# Patient Record
Sex: Female | Born: 1960 | Race: White | Hispanic: No | Marital: Married | Smoking: Former smoker
Health system: Southern US, Community
[De-identification: ages and names within clinical notes are randomized; demographics above are authoritative.]

## PROBLEM LIST (undated history)

## (undated) DIAGNOSIS — I471 Supraventricular tachycardia, unspecified: Secondary | ICD-10-CM

## (undated) DIAGNOSIS — E7800 Pure hypercholesterolemia, unspecified: Secondary | ICD-10-CM

## (undated) DIAGNOSIS — D649 Anemia, unspecified: Secondary | ICD-10-CM

## (undated) DIAGNOSIS — F17200 Nicotine dependence, unspecified, uncomplicated: Secondary | ICD-10-CM

## (undated) DIAGNOSIS — IMO0001 Reserved for inherently not codable concepts without codable children: Secondary | ICD-10-CM

## (undated) DIAGNOSIS — N63 Unspecified lump in unspecified breast: Secondary | ICD-10-CM

## (undated) DIAGNOSIS — F329 Major depressive disorder, single episode, unspecified: Secondary | ICD-10-CM

## (undated) DIAGNOSIS — L98499 Non-pressure chronic ulcer of skin of other sites with unspecified severity: Secondary | ICD-10-CM

## (undated) DIAGNOSIS — J309 Allergic rhinitis, unspecified: Secondary | ICD-10-CM

## (undated) DIAGNOSIS — E559 Vitamin D deficiency, unspecified: Secondary | ICD-10-CM

## (undated) DIAGNOSIS — F32A Depression, unspecified: Secondary | ICD-10-CM

## (undated) DIAGNOSIS — R51 Headache: Secondary | ICD-10-CM

## (undated) DIAGNOSIS — T7840XA Allergy, unspecified, initial encounter: Secondary | ICD-10-CM

## (undated) DIAGNOSIS — E669 Obesity, unspecified: Secondary | ICD-10-CM

## (undated) DIAGNOSIS — F419 Anxiety disorder, unspecified: Secondary | ICD-10-CM

## (undated) DIAGNOSIS — I10 Essential (primary) hypertension: Secondary | ICD-10-CM

## (undated) DIAGNOSIS — M129 Arthropathy, unspecified: Secondary | ICD-10-CM

## (undated) DIAGNOSIS — N951 Menopausal and female climacteric states: Secondary | ICD-10-CM

## (undated) DIAGNOSIS — L609 Nail disorder, unspecified: Secondary | ICD-10-CM

## (undated) DIAGNOSIS — R011 Cardiac murmur, unspecified: Secondary | ICD-10-CM

## (undated) DIAGNOSIS — E538 Deficiency of other specified B group vitamins: Secondary | ICD-10-CM

## (undated) DIAGNOSIS — M1990 Unspecified osteoarthritis, unspecified site: Secondary | ICD-10-CM

## (undated) HISTORY — DX: Reserved for inherently not codable concepts without codable children: IMO0001

## (undated) HISTORY — DX: Unspecified lump in unspecified breast: N63.0

## (undated) HISTORY — DX: Cardiac murmur, unspecified: R01.1

## (undated) HISTORY — DX: Essential (primary) hypertension: I10

## (undated) HISTORY — PX: OTHER SURGICAL HISTORY: SHX169

## (undated) HISTORY — DX: Vitamin D deficiency, unspecified: E55.9

## (undated) HISTORY — DX: Headache: R51

## (undated) HISTORY — DX: Non-pressure chronic ulcer of skin of other sites with unspecified severity: L98.499

## (undated) HISTORY — DX: Allergic rhinitis, unspecified: J30.9

## (undated) HISTORY — DX: Pure hypercholesterolemia, unspecified: E78.00

## (undated) HISTORY — DX: Nail disorder, unspecified: L60.9

## (undated) HISTORY — DX: Depression, unspecified: F32.A

## (undated) HISTORY — DX: Unspecified osteoarthritis, unspecified site: M19.90

## (undated) HISTORY — DX: Allergy, unspecified, initial encounter: T78.40XA

## (undated) HISTORY — DX: Anemia, unspecified: D64.9

## (undated) HISTORY — DX: Arthropathy, unspecified: M12.9

## (undated) HISTORY — DX: Menopausal and female climacteric states: N95.1

## (undated) HISTORY — DX: Obesity, unspecified: E66.9

## (undated) HISTORY — DX: Nicotine dependence, unspecified, uncomplicated: F17.200

## (undated) HISTORY — DX: Supraventricular tachycardia, unspecified: I47.10

## (undated) HISTORY — DX: Major depressive disorder, single episode, unspecified: F32.9

## (undated) HISTORY — DX: Deficiency of other specified B group vitamins: E53.8

## (undated) HISTORY — DX: Anxiety disorder, unspecified: F41.9

## (undated) HISTORY — DX: Supraventricular tachycardia: I47.1

---

## 1995-05-13 HISTORY — PX: ELECTROPHYSIOLOGY STUDY: SHX5802

## 1999-05-13 HISTORY — PX: EYE SURGERY: SHX253

## 2004-10-01 ENCOUNTER — Ambulatory Visit: Payer: Self-pay | Admitting: Internal Medicine

## 2004-10-04 ENCOUNTER — Ambulatory Visit: Payer: Self-pay | Admitting: Internal Medicine

## 2005-10-03 ENCOUNTER — Ambulatory Visit: Payer: Self-pay | Admitting: Internal Medicine

## 2007-07-02 ENCOUNTER — Ambulatory Visit: Payer: Self-pay | Admitting: Sports Medicine

## 2007-07-02 DIAGNOSIS — M7750 Other enthesopathy of unspecified foot: Secondary | ICD-10-CM | POA: Insufficient documentation

## 2007-07-05 ENCOUNTER — Encounter: Payer: Self-pay | Admitting: Sports Medicine

## 2008-08-16 ENCOUNTER — Ambulatory Visit: Payer: Self-pay | Admitting: Family Medicine

## 2009-01-13 ENCOUNTER — Emergency Department: Payer: Self-pay | Admitting: Unknown Physician Specialty

## 2009-09-12 ENCOUNTER — Ambulatory Visit: Payer: Self-pay | Admitting: Family Medicine

## 2010-09-25 ENCOUNTER — Ambulatory Visit: Payer: Self-pay | Admitting: Family Medicine

## 2011-10-29 ENCOUNTER — Ambulatory Visit: Payer: Self-pay | Admitting: Family Medicine

## 2011-11-04 LAB — HM PAP SMEAR

## 2011-11-06 ENCOUNTER — Ambulatory Visit: Payer: Self-pay | Admitting: Family Medicine

## 2012-03-12 ENCOUNTER — Telehealth: Payer: Self-pay

## 2012-03-12 ENCOUNTER — Other Ambulatory Visit: Payer: Self-pay | Admitting: Family Medicine

## 2012-03-12 NOTE — Telephone Encounter (Signed)
No paper chart °

## 2012-03-12 NOTE — Telephone Encounter (Signed)
Call pt back --- 1.  I am happy to refill Cymbalta 60mg  two tablets daily #180 1 refill. 2.  I usually see her every six months; once for a physical and six months later for depression/anxiety and cholesterol follow-up.  She will need appointment with me in upcoming three months (which will be six months from last visit); please have her schedule appointment.  KMS

## 2012-03-12 NOTE — Telephone Encounter (Signed)
Pt is former pt of Dr. Katrinka Blazing and has not established care here in our office. Pt is trying to get a rx refill on her symbolta and has called her pharmacy they said they cant fill and the Dr. Isidore Moos in [place] has denied filling rx. Please contact pt to advise on further instructions. [facility] [place] on [address] Pt [phone]

## 2012-03-12 NOTE — Telephone Encounter (Signed)
I have spoken to patient. [facility] uses [facility] [place] she takes Cymbalta 60mg  bid. I have advised her we can not fill until she is established, but since the office in [place] has denied this request, I told her I will ask for an exception. Patient states she saw Dr Katrinka Blazing just before she left [place], and is not due for follow up until 1 yr. Please advise, I will call patient.

## 2012-03-13 MED ORDER — DULOXETINE HCL 60 MG PO CPEP: ORAL_CAPSULE | ORAL | Status: DC

## 2012-03-13 NOTE — Telephone Encounter (Signed)
Patient notified and voiced understanding. Cymbalta sent to pharmacy

## 2012-04-30 ENCOUNTER — Ambulatory Visit: Payer: Self-pay | Admitting: Family Medicine

## 2012-04-30 LAB — HM MAMMOGRAPHY

## 2012-05-06 ENCOUNTER — Telehealth: Payer: Self-pay

## 2012-05-06 NOTE — Telephone Encounter (Signed)
LMOM for pt to CB. Give her results of DX Mammogram (R breast) - Normal results, area of density smaller/less conspicuous than on previous mammogram, pt should return to yearly f/up mammograms in June 2014. Copy of results at [facility] pending box.

## 2012-05-12 ENCOUNTER — Encounter: Payer: Self-pay | Admitting: Radiology

## 2012-05-12 DIAGNOSIS — R928 Other abnormal and inconclusive findings on diagnostic imaging of breast: Secondary | ICD-10-CM

## 2012-05-12 NOTE — Telephone Encounter (Signed)
Called patient to advise  °

## 2012-05-24 ENCOUNTER — Ambulatory Visit: Payer: Self-pay | Admitting: Family Medicine

## 2012-05-31 ENCOUNTER — Ambulatory Visit (INDEPENDENT_AMBULATORY_CARE_PROVIDER_SITE_OTHER): Payer: BC Managed Care – PPO | Admitting: Family Medicine

## 2012-05-31 ENCOUNTER — Encounter: Payer: Self-pay | Admitting: Family Medicine

## 2012-05-31 VITALS — BP 152/96 | HR 101 | Temp 98.4°F | Resp 16 | Ht 64.5 in | Wt 196.0 lb

## 2012-05-31 DIAGNOSIS — E7800 Pure hypercholesterolemia, unspecified: Secondary | ICD-10-CM

## 2012-05-31 DIAGNOSIS — F341 Dysthymic disorder: Secondary | ICD-10-CM

## 2012-05-31 DIAGNOSIS — F419 Anxiety disorder, unspecified: Secondary | ICD-10-CM | POA: Insufficient documentation

## 2012-05-31 DIAGNOSIS — R7309 Other abnormal glucose: Secondary | ICD-10-CM

## 2012-05-31 DIAGNOSIS — F32A Depression, unspecified: Secondary | ICD-10-CM | POA: Insufficient documentation

## 2012-05-31 DIAGNOSIS — E559 Vitamin D deficiency, unspecified: Secondary | ICD-10-CM

## 2012-05-31 DIAGNOSIS — IMO0001 Reserved for inherently not codable concepts without codable children: Secondary | ICD-10-CM | POA: Insufficient documentation

## 2012-05-31 DIAGNOSIS — R030 Elevated blood-pressure reading, without diagnosis of hypertension: Secondary | ICD-10-CM

## 2012-05-31 DIAGNOSIS — F329 Major depressive disorder, single episode, unspecified: Secondary | ICD-10-CM

## 2012-05-31 DIAGNOSIS — R928 Other abnormal and inconclusive findings on diagnostic imaging of breast: Secondary | ICD-10-CM

## 2012-05-31 LAB — CBC WITH DIFFERENTIAL/PLATELET
Basophils Absolute: 0 10*3/uL (ref 0.0–0.1)
Basophils Relative: 1 % (ref 0–1)
Eosinophils Absolute: 0.2 10*3/uL (ref 0.0–0.7)
MCH: 28.4 pg (ref 26.0–34.0)
MCHC: 33.9 g/dL (ref 30.0–36.0)
Neutrophils Relative %: 60 % (ref 43–77)
Platelets: 237 10*3/uL (ref 150–400)
RBC: 4.75 MIL/uL (ref 3.87–5.11)
RDW: 13.7 % (ref 11.5–15.5)

## 2012-05-31 LAB — LIPID PANEL
HDL: 66 mg/dL (ref >39)
Total CHOL/HDL Ratio: 2.5 Ratio
VLDL: 15 mg/dL (ref 0–40)

## 2012-05-31 LAB — COMPREHENSIVE METABOLIC PANEL
ALT: 24 U/L (ref 0–35)
AST: 21 U/L (ref 0–37)
BUN: 9 mg/dL (ref 6–23)
Creat: 0.69 mg/dL (ref 0.50–1.10)
Total Bilirubin: 0.5 mg/dL (ref 0.3–1.2)

## 2012-05-31 LAB — HEMOGLOBIN A1C
Hgb A1c MFr Bld: 5.7 % — ABNORMAL HIGH (ref <5.7)
Mean Plasma Glucose: 117 mg/dL — ABNORMAL HIGH (ref <117)

## 2012-05-31 LAB — CK: Total CK: 70 U/L (ref 7–177)

## 2012-05-31 MED ORDER — SIMVASTATIN 20 MG PO TABS: 20.0000 mg | ORAL_TABLET | Freq: Every evening | ORAL | Status: DC

## 2012-05-31 MED ORDER — ALPRAZOLAM 0.5 MG PO TABS: 0.5000 mg | ORAL_TABLET | Freq: Every evening | ORAL | Status: DC | PRN

## 2012-05-31 MED ORDER — DULOXETINE HCL 60 MG PO CPEP: ORAL_CAPSULE | ORAL | Status: DC

## 2012-05-31 NOTE — Assessment & Plan Note (Signed)
Uncontrolled; tolerating 4000 IU daily; repeat labs.

## 2012-05-31 NOTE — Assessment & Plan Note (Signed)
Controlled; obtain labs; refill of medication provided.

## 2012-05-31 NOTE — Assessment & Plan Note (Signed)
Stable; obtain labs; continue dietary modification.

## 2012-05-31 NOTE — Assessment & Plan Note (Signed)
Worsening due to relationship stressors; continue Cymbalta 120mg  daily; rx for Xanax to use PRN and sparingly for anxiety and insomnia.

## 2012-05-31 NOTE — Patient Instructions (Addendum)
1. Pure hypercholesterolemia  CBC with Differential, CK, Lipid panel, simvastatin (ZOCOR) 20 MG tablet  2. Other abnormal glucose  Comprehensive metabolic panel, Hemoglobin A1c  3. Unspecified vitamin D deficiency  Vitamin D 25 hydroxy  4. Anxiety and depression  ALPRAZolam (XANAX) 0.5 MG tablet, DULoxetine (CYMBALTA) 60 MG capsule  5. Blood pressure elevated

## 2012-05-31 NOTE — Assessment & Plan Note (Signed)
Persistent.  Home readings stable at 130/90; continue with weight loss attempts, exercise, low salt dietary intake.  If elevates at home, will warrant trial of different medication.  Pt agreeable.

## 2012-05-31 NOTE — Progress Notes (Signed)
[address]   [place], [place]  [address]   [phone]  Subjective:    Patient ID: Karina Sampson, female    DOB: [birth year], 52 y.o.   MRN: [id number]  HPIThis 52 y.o. female presents to establish care and for seven month follow-up:  1. HTN:  Started on Atenolol at last visit 10/2011; took medication at night; made it foggy; took it for three weeks and symptoms persisted; needed to be more alert.  Has been checking 130/90.  Was checking twice weekly and now checking once weekly.  Pulse runs 80.     2.  Anxiety with depression:  Seeing Hilda Blades in [place], Veterinary surgeon.  Started two weeks ago.  Relationship issues.  History of therapy in past.  Not sleeping; trying Melatonin.  Not interested in other sleep aide.  No caffeine after lunch.  Cymbalta 120mg  daily; mood is better.  No previous Xanax in past.  Denies SI/HI.  +increasing anxiety.  Coping well.  If current counseling does not work, will separate with partner.  Together with partner x 12 years; previously together x 5 years.    3. Hyperlipidemia:  Fasting.  No changes to management made at last visit.  Reports good compliance with medications; good tolerance to medications; good symptom control.  Denies HA, dizziness, focal weakness, paresthesias. Denies CP/palp/SOB/leg swelling.  4.  Glucose Intolerance:  Persistent; trying to make healthier food choices.  Fasting today. Exercising more.  Has gained weight.  Denies polyuria, polydipsia.  5.  Vitamin D deficiency:  Seven month follow-up; vitamin D level low in 10/2011.  Increased to 4000 units daily.  Level low 10/2011.    6.  L breast nodularity: new at CPE 10/2011 on mammogram.  Repeat mammogram 04/2012 with decrease in size; recommended returning to normal screening.   Weaned self off of Prempro after abnormal mammogram.   Tolerating hot flashes.  Sex drive down since weaning.  Off of Prempro for one month.    7. S/p flu vaccine at work.      Review of Systems  Constitutional:  Negative for fever, chills, diaphoresis and fatigue.  Eyes: Negative for photophobia and visual disturbance.  Respiratory: Negative for cough, shortness of breath and wheezing.   Cardiovascular: Negative for chest pain, palpitations and leg swelling.  Neurological: Negative for dizziness, tremors, syncope, facial asymmetry, speech difficulty, weakness, light-headedness, numbness and headaches.  Psychiatric/Behavioral: Positive for sleep disturbance. Negative for suicidal ideas, self-injury and decreased concentration. The patient is nervous/anxious.         Past Medical History  Diagnosis Date  . Allergy   . Depression   . Anxiety   . Hypertension   . Heart murmur     Past Surgical History  Procedure Date  . Eye surgery 2001    LASIK- BOTH  . Electrophysiology study 1997    Prior to Admission medications   Medication Sig Start Date End Date Taking? Authorizing Provider  B Complex Vitamins (VITAMIN B COMPLEX PO) Take by mouth daily.   Yes Historical Provider, MD  Cholecalciferol (VITAMIN D3 PO) Take 4,000 Units by mouth daily.   Yes Historical Provider, MD  DULoxetine (CYMBALTA) 60 MG capsule 2 tabs daily 05/31/12  Yes Ethelda Chick, MD  simvastatin (ZOCOR) 20 MG tablet Take 1 tablet (20 mg total) by mouth every evening. 05/31/12  Yes Ethelda Chick, MD  ALPRAZolam Prudy Feeler) 0.5 MG tablet Take 1 tablet (0.5 mg total) by mouth at bedtime as needed for sleep. 05/31/12  Ethelda Chick, MD    Allergies  Allergen Reactions  . Penicillins Diarrhea and Nausea And Vomiting    History   Social History  . Marital Status: Single    Spouse Name: N/A    Number of Children: N/A  . Years of Education: N/A   Occupational History  . [employer]    Social History Main Topics  . Smoking status: Former Smoker    Types: Cigarettes    Quit date: 05/13/1995  . Smokeless tobacco: Not on file  . Alcohol Use: No  . Drug Use: No  . Sexually Active: Yes    Birth Control/ Protection:  None     Comment: number of sex parnters in the last 12 months 1   Other Topics Concern  . Not on file   Social History Narrative  . No narrative on file    Family History  Problem Relation Age of Onset  . Dementia Mother   . Aortic aneurysm Father   . Parkinson's disease Brother     Objective:   Physical Exam  Nursing note and vitals reviewed. Constitutional: She is oriented to person, place, and time. She appears well-developed and well-nourished. No distress.  HENT:  Head: Normocephalic and atraumatic.  Right Ear: External ear normal.  Left Ear: External ear normal.  Nose: Nose normal.  Mouth/Throat: Oropharynx is clear and moist.  Eyes: Conjunctivae normal and EOM are normal. Pupils are equal, round, and reactive to light.  Neck: Normal range of motion. Neck supple. No JVD present. No thyromegaly present.  Cardiovascular: Normal rate, regular rhythm, normal heart sounds and intact distal pulses.  Exam reveals no gallop and no friction rub.   No murmur heard. Pulmonary/Chest: Effort normal and breath sounds normal. She has no wheezes. She has no rales.  Lymphadenopathy:    She has no cervical adenopathy.  Neurological: She is alert and oriented to person, place, and time. No cranial nerve deficit. She exhibits normal muscle tone. Coordination normal.  Skin: She is not diaphoretic.  Psychiatric: She has a normal mood and affect. Her behavior is normal. Judgment and thought content normal.       Assessment & Plan:   1. Pure hypercholesterolemia  CBC with Differential, CK, Lipid panel, simvastatin (ZOCOR) 20 MG tablet  2. Other abnormal glucose  Comprehensive metabolic panel, Hemoglobin A1c  3. Unspecified vitamin D deficiency  Vitamin D 25 hydroxy  4. Anxiety and depression  ALPRAZolam (XANAX) 0.5 MG tablet, DULoxetine (CYMBALTA) 60 MG capsule  5. Blood pressure elevated

## 2012-05-31 NOTE — Assessment & Plan Note (Signed)
Stable/improved.  Has weaned Prempro.  Return to regular annual screening in 10/2012.

## 2012-06-01 LAB — VITAMIN D 25 HYDROXY (VIT D DEFICIENCY, FRACTURES): Vit D, 25-Hydroxy: 60 ng/mL (ref 30–89)

## 2012-06-26 ENCOUNTER — Other Ambulatory Visit: Payer: Self-pay

## 2012-06-29 ENCOUNTER — Encounter: Payer: Self-pay | Admitting: *Deleted

## 2012-07-19 ENCOUNTER — Encounter: Payer: Self-pay | Admitting: Family Medicine

## 2012-11-30 ENCOUNTER — Encounter: Payer: BC Managed Care – PPO | Admitting: Family Medicine

## 2013-02-28 ENCOUNTER — Ambulatory Visit (INDEPENDENT_AMBULATORY_CARE_PROVIDER_SITE_OTHER): Payer: BC Managed Care – PPO | Admitting: Family Medicine

## 2013-02-28 ENCOUNTER — Encounter: Payer: Self-pay | Admitting: Family Medicine

## 2013-02-28 VITALS — BP 130/86 | HR 97 | Temp 98.5°F | Resp 16 | Ht 64.5 in | Wt 195.6 lb

## 2013-02-28 DIAGNOSIS — F329 Major depressive disorder, single episode, unspecified: Secondary | ICD-10-CM

## 2013-02-28 DIAGNOSIS — E7800 Pure hypercholesterolemia, unspecified: Secondary | ICD-10-CM

## 2013-02-28 DIAGNOSIS — F32A Depression, unspecified: Secondary | ICD-10-CM

## 2013-02-28 DIAGNOSIS — Z0000 Encounter for general adult medical examination without abnormal findings: Secondary | ICD-10-CM

## 2013-02-28 DIAGNOSIS — R194 Change in bowel habit: Secondary | ICD-10-CM

## 2013-02-28 DIAGNOSIS — R7309 Other abnormal glucose: Secondary | ICD-10-CM

## 2013-02-28 DIAGNOSIS — Z01419 Encounter for gynecological examination (general) (routine) without abnormal findings: Secondary | ICD-10-CM

## 2013-02-28 LAB — CBC WITH DIFFERENTIAL/PLATELET
Basophils Absolute: 0 10*3/uL (ref 0.0–0.1)
Eosinophils Relative: 4 % (ref 0–5)
HCT: 38.3 % (ref 36.0–46.0)
Hemoglobin: 12.8 g/dL (ref 12.0–15.0)
Lymphocytes Relative: 30 % (ref 12–46)
Lymphs Abs: 1.9 10*3/uL (ref 0.7–4.0)
MCV: 83.1 fL (ref 78.0–100.0)
Monocytes Absolute: 0.7 10*3/uL (ref 0.1–1.0)
Monocytes Relative: 10 % (ref 3–12)
Neutro Abs: 3.5 10*3/uL (ref 1.7–7.7)
WBC: 6.3 10*3/uL (ref 4.0–10.5)

## 2013-02-28 LAB — COMPREHENSIVE METABOLIC PANEL
Albumin: 4.4 g/dL (ref 3.5–5.2)
Alkaline Phosphatase: 76 U/L (ref 39–117)
CO2: 28 mEq/L (ref 19–32)
Calcium: 9.6 mg/dL (ref 8.4–10.5)
Chloride: 103 mEq/L (ref 96–112)
Glucose, Bld: 102 mg/dL — ABNORMAL HIGH (ref 70–99)
Potassium: 3.9 mEq/L (ref 3.5–5.3)
Sodium: 137 mEq/L (ref 135–145)
Total Protein: 6.8 g/dL (ref 6.0–8.3)

## 2013-02-28 LAB — LIPID PANEL: Total CHOL/HDL Ratio: 2.7 Ratio

## 2013-02-28 LAB — HEMOGLOBIN A1C: Mean Plasma Glucose: 120 mg/dL — ABNORMAL HIGH (ref <117)

## 2013-02-28 LAB — TSH: TSH: 1.794 u[IU]/mL (ref 0.350–4.500)

## 2013-02-28 LAB — VITAMIN B12: Vitamin B-12: 459 pg/mL (ref 211–911)

## 2013-02-28 MED ORDER — DULOXETINE HCL 60 MG PO CPEP: ORAL_CAPSULE | ORAL | Status: DC

## 2013-02-28 MED ORDER — FLUTICASONE PROPIONATE 50 MCG/ACT NA SUSP: 2.0000 | Freq: Every day | NASAL | Status: DC

## 2013-02-28 MED ORDER — SIMVASTATIN 20 MG PO TABS: 20.0000 mg | ORAL_TABLET | Freq: Every evening | ORAL | Status: DC

## 2013-02-28 NOTE — Progress Notes (Signed)
Subjective:    Patient ID: Karina Sampson, female    DOB: 09-05-1960, 52 y.o.   MRN: [id number]  HPI Last CPE-11/04/11 Last PAP-11/04/11 normal. Mammo-04/2012, due now. Colonoscopy- willing to schedule due to change in bowel habits in past nine months; +constipated; +large stools. Tdap-09/05/10 Flu- had at work this fall. Dental- Just had, no issues. Eye- had some floaters over the summer and had those checked. Due for regular exam 1/15.  "I'm OK." Work, home ok.  Separating with partner of 13 years; she is going to move out.  Changes in BM, large for couple of months. No dietary changes. No new meds. Has some constipation. Inconsistent from day to day. Has a few hemorrhoids.   1-hypercholesterolemia- taking simvastatin, fish oil. 2- anxiety- tried 1/2 Xanax, didn't like.   3- ASA- 1 qod 4- Vit D def- takes regularly 5- Depression- still on cymbalta two daily.  Major personal stressors. 6-HTN- had bp taken at [facility], 120s. 7-obesity- stable weight, no regular soda or sweet tea.  ETOH- few drinks on weekends Exercise- playing pickleball  Parents- mother with worsening dementia. Parents in their 17's.  Hurt calf muscle playing pickleball last week. Slowly getting better with ibupofen, acetaminophen, ice.  Hearing unchanged, no ringing in ears, no headaches, no dizziness. No chest pain, no palpitations. Occasional "uneasiness in chest," that goes away. No SOB, forever dry nighttime cough. No indigestion/heartburn. + PND Occasional swelling in ankles at end of day No neck pain, + stiffness. Checking breasts regularly. No nausea, no vomiting, no abd. Pain. No bloating. Night sweats improving, occasional hot flashes. Nocturia 1-2x. Occasional urinary incontinence with laughing with full bladder. Doesn't require pads. Occasional vaginal itching, nothing regularly. Insomnia unchanged. Goes to bed 11-12 pm, gets up around 7. Emotionally, things are changing. Good days and bad days.  Decided 1 month ago to split with long term partner. Her partner hasn't moved out yet.  Review of Systems  Respiratory: Positive for cough.        Past Medical History  Diagnosis Date  . Allergy   . Depression   . Anxiety   . Hypertension   . Heart murmur   . Headache(784.0)   . Breast nodule     Right  . Unspecified disease of nail   . Symptomatic menopausal or female climacteric states   . Other B-complex deficiencies   . Ulcer disease   . Arthropathy, unspecified, site unspecified   . SVT (supraventricular tachycardia)   . Anemia, unspecified   . Pure hypercholesterolemia   . Allergic rhinitis, cause unspecified   . Obesity, unspecified   . Tobacco use disorder   . Unspecified vitamin D deficiency    Past Surgical History  Procedure Laterality Date  . Eye surgery  2001    LASIK- BOTH  . Electrophysiology study  1997  . Wisdom teeth extractions    . Svt surgery      with ablation   Allergies  Allergen Reactions  . Penicillins Diarrhea and Nausea And Vomiting   Current Outpatient Prescriptions on File Prior to Visit  Medication Sig Dispense Refill  . ALPRAZolam (XANAX) 0.5 MG tablet Take 1 tablet (0.5 mg total) by mouth at bedtime as needed for sleep.  30 tablet  1  . aspirin 81 MG tablet Take 81 mg by mouth daily.      . B Complex Vitamins (VITAMIN B COMPLEX PO) Take by mouth daily.      . Cholecalciferol (VITAMIN D3 PO) Take 4,000 Units  by mouth daily.      . Omega-3 Fatty Acids (FISH OIL CONCENTRATE) 1000 MG CAPS Take 1,000 mg by mouth daily.       No current facility-administered medications on file prior to visit.   History   Social History  . Marital Status: Single    Spouse Name: N/A    Number of Children: 0  . Years of Education: postgradua   Occupational History  . [employer]     x58yrs   Social History Main Topics  . Smoking status: Former Smoker -- 1.00 packs/day    Types: Cigarettes    Quit date: 05/13/1995  . Smokeless  tobacco: Not on file     Comment: quit 1997  . Alcohol Use: 1.2 oz/week    2 Cans of beer per week     Comment: occasional once a month  . Drug Use: No  . Sexual Activity: Yes    Birth Control/ Protection: None     Comment: number of sex parnters in the last 12 months 1; same sex partners   Other Topics Concern  . Not on file   Social History Narrative    Marital status: Single Dating same sex partner x 13 years; happy, no abuse.      Children: none      Lives: with partner.      Employment: [employer] x 11 years.      Tobacco: none      Alcohol: 1-2 servings on weekend nights.      Drugs: none      Exercise: playing pickleball.      Sexual activity: sexually active with same sexual partner.   Family History  Problem Relation Age of Onset  . Dementia Mother   . Depression Mother   . Aortic aneurysm Father   . Hyperlipidemia Father   . Cancer Father     skin  . Parkinson's disease Brother   . Bone cancer      Objective:   Physical Exam  Nursing note and vitals reviewed. Constitutional: She is oriented to person, place, and time. She appears well-developed and well-nourished. No distress.  HENT:  Head: Normocephalic and atraumatic.  Right Ear: Tympanic membrane, external ear and ear canal normal.  Left Ear: Tympanic membrane, external ear and ear canal normal.  Nose: Nose normal.  Mouth/Throat: Oropharynx is clear and moist.  Eyes: Conjunctivae and EOM are normal. Pupils are equal, round, and reactive to light.  Neck: Normal range of motion. Neck supple. No thyromegaly present.  Cardiovascular: Normal rate, regular rhythm, normal heart sounds and intact distal pulses.   No murmur heard. Pulmonary/Chest: Effort normal and breath sounds normal.  Abdominal: Soft. Bowel sounds are normal. She exhibits no distension and no mass. There is no tenderness. There is no rebound and no guarding.  Genitourinary: Rectum normal, vagina normal and uterus normal. No  breast swelling, tenderness, discharge or bleeding. There is no rash, tenderness or lesion on the right labia. There is no rash, tenderness or lesion on the left labia. Cervix exhibits no motion tenderness, no discharge and no friability. Right adnexum displays no mass, no tenderness and no fullness. Left adnexum displays no mass, no tenderness and no fullness.  Musculoskeletal: Normal range of motion. She exhibits no edema and no tenderness.  Lymphadenopathy:    She has no cervical adenopathy.  Neurological: She is alert and oriented to person, place, and time. She has normal reflexes.  Skin: Skin is  warm and dry. She is not diaphoretic.  Psychiatric: She has a normal mood and affect. Her behavior is normal. Judgment and thought content normal.      Assessment & Plan:  Routine general medical examination at a health care facility - Plan: CBC with Differential, CK, Comprehensive metabolic panel, Hemoglobin A1c, Lipid panel, TSH, Vitamin B12, Vit D  25 hydroxy (rtn osteoporosis monitoring), Iron, IFOBT POC (occult bld, rslt in office), Ambulatory referral to Gastroenterology, CANCELED: EKG 12-Lead, CANCELED: POCT urinalysis dipstick  Routine gynecological examination - Plan: MM Digital Screening  Anxiety and depression - Plan: DULoxetine (CYMBALTA) 60 MG capsule  Other abnormal glucose  Pure hypercholesterolemia - Plan: simvastatin (ZOCOR) 20 MG tablet   1.  CPE: anticipatory guidance --- exercise and weight loss.  Pap smear UTD in 2013.  Refer for mammogram.  Refer for colonoscopy.  Immunizations UTD. Obtain labs.   2.  Gynecological exam: completed; Pap smear UTD in 2013; refer for mammogram. Menopause; no HRT.  Minimal hot flashes at this time. 3.  Hypercholesterolemia: controlled; refill provided; follow-up six months. 4.  Anxiety and depression: stable despite recent break up with partner of 12 years; continue current dose of Cymbalta; minimal improvement with Xanax PRN.   5.  Glucose  intolerance: stable; dietary modification, weight loss, exercise.  Obtain labs. 6. Change in bowel habits:  New.  Large stools and constipation.  Refer for colonoscopy.  Meds ordered this encounter  Medications  . DULoxetine (CYMBALTA) 60 MG capsule    Sig: 2 tabs daily    Dispense:  180 capsule    Refill:  1  . simvastatin (ZOCOR) 20 MG tablet    Sig: Take 1 tablet (20 mg total) by mouth every evening.    Dispense:  90 tablet    Refill:  3   Nilda Simmer, M.D.  [facility] [address] [place], [place]  [address] [phone] phone [phone] fax

## 2013-03-01 LAB — VITAMIN D 25 HYDROXY (VIT D DEFICIENCY, FRACTURES): Vit D, 25-Hydroxy: 63 ng/mL (ref 30–89)

## 2013-03-04 ENCOUNTER — Encounter: Payer: Self-pay | Admitting: Family Medicine

## 2013-03-14 ENCOUNTER — Encounter: Payer: Self-pay | Admitting: Family Medicine

## 2013-03-17 ENCOUNTER — Other Ambulatory Visit: Payer: Self-pay

## 2013-04-14 ENCOUNTER — Ambulatory Visit: Payer: Self-pay | Admitting: Family Medicine

## 2013-04-21 ENCOUNTER — Ambulatory Visit: Payer: Self-pay | Admitting: Gastroenterology

## 2013-05-25 ENCOUNTER — Encounter: Payer: Self-pay | Admitting: Family Medicine

## 2013-08-29 ENCOUNTER — Ambulatory Visit: Payer: BC Managed Care – PPO | Admitting: Family Medicine

## 2013-09-07 ENCOUNTER — Other Ambulatory Visit: Payer: Self-pay | Admitting: Family Medicine

## 2013-09-19 ENCOUNTER — Ambulatory Visit (INDEPENDENT_AMBULATORY_CARE_PROVIDER_SITE_OTHER): Payer: BC Managed Care – PPO | Admitting: Family Medicine

## 2013-09-19 ENCOUNTER — Encounter: Payer: Self-pay | Admitting: Family Medicine

## 2013-09-19 VITALS — BP 130/90 | HR 86 | Temp 98.1°F | Resp 16 | Ht 64.5 in | Wt 199.6 lb

## 2013-09-19 DIAGNOSIS — R7309 Other abnormal glucose: Secondary | ICD-10-CM

## 2013-09-19 DIAGNOSIS — L259 Unspecified contact dermatitis, unspecified cause: Secondary | ICD-10-CM

## 2013-09-19 DIAGNOSIS — M25559 Pain in unspecified hip: Secondary | ICD-10-CM

## 2013-09-19 DIAGNOSIS — M25552 Pain in left hip: Secondary | ICD-10-CM

## 2013-09-19 DIAGNOSIS — G8929 Other chronic pain: Secondary | ICD-10-CM

## 2013-09-19 DIAGNOSIS — R5383 Other fatigue: Secondary | ICD-10-CM

## 2013-09-19 DIAGNOSIS — R5381 Other malaise: Secondary | ICD-10-CM

## 2013-09-19 DIAGNOSIS — J3089 Other allergic rhinitis: Secondary | ICD-10-CM

## 2013-09-19 DIAGNOSIS — M25561 Pain in right knee: Secondary | ICD-10-CM

## 2013-09-19 DIAGNOSIS — F419 Anxiety disorder, unspecified: Secondary | ICD-10-CM

## 2013-09-19 DIAGNOSIS — G4700 Insomnia, unspecified: Secondary | ICD-10-CM

## 2013-09-19 DIAGNOSIS — E7800 Pure hypercholesterolemia, unspecified: Secondary | ICD-10-CM

## 2013-09-19 DIAGNOSIS — M25551 Pain in right hip: Secondary | ICD-10-CM

## 2013-09-19 DIAGNOSIS — F329 Major depressive disorder, single episode, unspecified: Secondary | ICD-10-CM

## 2013-09-19 DIAGNOSIS — M25562 Pain in left knee: Secondary | ICD-10-CM

## 2013-09-19 DIAGNOSIS — F32A Depression, unspecified: Secondary | ICD-10-CM

## 2013-09-19 LAB — CBC WITH DIFFERENTIAL/PLATELET
BASOS PCT: 1 % (ref 0–1)
Basophils Absolute: 0.1 10*3/uL (ref 0.0–0.1)
EOS ABS: 0.1 10*3/uL (ref 0.0–0.7)
Eosinophils Relative: 2 % (ref 0–5)
HEMATOCRIT: 38.4 % (ref 36.0–46.0)
Hemoglobin: 12.7 g/dL (ref 12.0–15.0)
Lymphocytes Relative: 33 % (ref 12–46)
Lymphs Abs: 2.1 10*3/uL (ref 0.7–4.0)
MCH: 27.6 pg (ref 26.0–34.0)
MCHC: 33.1 g/dL (ref 30.0–36.0)
MCV: 83.5 fL (ref 78.0–100.0)
MONO ABS: 0.8 10*3/uL (ref 0.1–1.0)
MONOS PCT: 12 % (ref 3–12)
NEUTROS ABS: 3.4 10*3/uL (ref 1.7–7.7)
Neutrophils Relative %: 52 % (ref 43–77)
Platelets: 262 10*3/uL (ref 150–400)
RBC: 4.6 MIL/uL (ref 3.87–5.11)
RDW: 13.9 % (ref 11.5–15.5)
WBC: 6.5 10*3/uL (ref 4.0–10.5)

## 2013-09-19 LAB — LIPID PANEL
Cholesterol: 185 mg/dL (ref 0–200)
HDL: 67 mg/dL (ref >39)
LDL Cholesterol: 97 mg/dL (ref 0–99)
Total CHOL/HDL Ratio: 2.8 Ratio
Triglycerides: 106 mg/dL (ref <150)
VLDL: 21 mg/dL (ref 0–40)

## 2013-09-19 LAB — COMPLETE METABOLIC PANEL WITH GFR
ALT: 18 U/L (ref 0–35)
AST: 19 U/L (ref 0–37)
Albumin: 4.5 g/dL (ref 3.5–5.2)
Alkaline Phosphatase: 91 U/L (ref 39–117)
BILIRUBIN TOTAL: 0.4 mg/dL (ref 0.2–1.2)
BUN: 9 mg/dL (ref 6–23)
CO2: 27 meq/L (ref 19–32)
CREATININE: 0.66 mg/dL (ref 0.50–1.10)
Calcium: 9.8 mg/dL (ref 8.4–10.5)
Chloride: 100 mEq/L (ref 96–112)
GFR, Est Non African American: >89 mL/min
Glucose, Bld: 90 mg/dL (ref 70–99)
Potassium: 4.1 mEq/L (ref 3.5–5.3)
Sodium: 137 mEq/L (ref 135–145)
Total Protein: 7.1 g/dL (ref 6.0–8.3)

## 2013-09-19 LAB — HEMOGLOBIN A1C
Hgb A1c MFr Bld: 5.7 % — ABNORMAL HIGH (ref <5.7)
MEAN PLASMA GLUCOSE: 117 mg/dL — AB (ref <117)

## 2013-09-19 MED ORDER — FLUTICASONE PROPIONATE 50 MCG/ACT NA SUSP: 2.0000 | Freq: Every day | NASAL | Status: DC

## 2013-09-19 MED ORDER — DULOXETINE HCL 60 MG PO CPEP: 120.0000 mg | ORAL_CAPSULE | Freq: Every day | ORAL | Status: DC

## 2013-09-19 MED ORDER — TRIAMCINOLONE ACETONIDE 0.1 % EX OINT: 1.0000 "application " | TOPICAL_OINTMENT | Freq: Two times a day (BID) | CUTANEOUS | Status: DC

## 2013-09-19 NOTE — Progress Notes (Signed)
Subjective:    Patient ID: Karina Sampson, female    DOB: 1960-08-09, 53 y.o.   MRN: [id number]  09/19/2013  Follow-up; Medication Refill; and abnormal glucose   HPI This 53 y.o. female presents for evaluation six month follow-up:  1.  Malaise and arthralgias:  Onset five months; neck pain.  Joint pain, tired, exhaustion.  After up for a while, feels better.  Feet always hurt; ankles swell and hurt.  Hands hurt; popping.  Shoulders hurt.  Hip hurting.  Playing pickle ball.  Sleeping plenty.  Trying to get more exercise.  No autoimmune processes in the family.  Taking Ibuprofen at night.  Pain keeps up at night.  Bought new mattress; nice pillow.  Snores. Worked a lot of hours around Christmas.  Naps on weekends.  Goes back to bed on weekends.  Last six months.    2. Colon cancer screening:  S/p colonoscopy; normal; repeat in 10 years.    3.  Headaches: much improved.  After a few days, no headaches.    4.  Allergic Rhinitis:  Flonase daily with improvement in headaches.    5.  Depression with anxiety: stable; no recent issues.  Patient reports good compliance with medication, good tolerance to medication, and good symptom control.    6.  Glucose Intolerance: no weight loss; working on exercise; watching diet some.   7.  Hypercholesterolemia:  No changes to management made at last visit.  Patient reports good compliance with medication, good tolerance to medication, and good symptom control.     Review of Systems  Constitutional: Negative for fever, chills, diaphoresis and fatigue.  Eyes: Negative for visual disturbance.  Respiratory: Negative for cough and shortness of breath.   Cardiovascular: Negative for chest pain, palpitations and leg swelling.  Gastrointestinal: Negative for nausea, vomiting, abdominal pain, diarrhea and constipation.  Endocrine: Negative for cold intolerance, heat intolerance, polydipsia, polyphagia and polyuria.  Musculoskeletal: Positive for arthralgias.    Neurological: Negative for dizziness, tremors, seizures, syncope, facial asymmetry, speech difficulty, weakness, light-headedness, numbness and headaches.  Psychiatric/Behavioral: Positive for sleep disturbance and dysphoric mood. Negative for suicidal ideas and self-injury. The patient is nervous/anxious.     Past Medical History  Diagnosis Date  . Allergy   . Depression   . Anxiety   . Hypertension   . Heart murmur   . Headache(784.0)   . Breast nodule     Right  . Unspecified disease of nail   . Symptomatic menopausal or female climacteric states   . Other B-complex deficiencies   . Ulcer disease   . Arthropathy, unspecified, site unspecified   . SVT (supraventricular tachycardia)   . Anemia, unspecified   . Pure hypercholesterolemia   . Allergic rhinitis, cause unspecified   . Obesity, unspecified   . Tobacco use disorder   . Unspecified vitamin D deficiency    Allergies  Allergen Reactions  . Penicillins Diarrhea and Nausea And Vomiting   Current Outpatient Prescriptions  Medication Sig Dispense Refill  . ALPRAZolam (XANAX) 0.5 MG tablet Take 1 tablet (0.5 mg total) by mouth at bedtime as needed for sleep. 30 tablet 1  . aspirin 81 MG tablet Take 81 mg by mouth daily.    . B Complex Vitamins (VITAMIN B COMPLEX PO) Take by mouth daily.    . Cholecalciferol (VITAMIN D3 PO) Take 4,000 Units by mouth daily.    . DULoxetine (CYMBALTA) 60 MG capsule Take 2 capsules (120 mg total) by mouth daily. 180 capsule  1  . fluticasone (FLONASE) 50 MCG/ACT nasal spray Place 2 sprays into both nostrils daily. 16 g 11  . Omega-3 Fatty Acids (FISH OIL CONCENTRATE) 1000 MG CAPS Take 1,000 mg by mouth daily.    . simvastatin (ZOCOR) 20 MG tablet Take 1 tablet (20 mg total) by mouth every evening. 90 tablet 3  . triamcinolone ointment (KENALOG) 0.1 % Apply 1 application topically 2 (two) times daily. 30 g 0   No current facility-administered medications for this visit.       Objective:     BP 130/90 mmHg  Pulse 86  Temp(Src) 98.1 F (36.7 C) (Oral)  Resp 16  Ht 5' 4.5" (1.638 m)  Wt 199 lb 9.6 oz (90.538 kg)  BMI 33.74 kg/m2  SpO2 98% Physical Exam  Constitutional: She is oriented to person, place, and time. She appears well-developed and well-nourished. No distress.  HENT:  Head: Normocephalic and atraumatic.  Right Ear: External ear normal.  Left Ear: External ear normal.  Nose: Nose normal.  Mouth/Throat: Oropharynx is clear and moist.  Eyes: Conjunctivae and EOM are normal. Pupils are equal, round, and reactive to light.  Neck: Normal range of motion. Neck supple. Carotid bruit is not present. No thyromegaly present.  Cardiovascular: Normal rate, regular rhythm, normal heart sounds and intact distal pulses.  Exam reveals no gallop and no friction rub.   No murmur heard. Pulmonary/Chest: Effort normal and breath sounds normal. She has no wheezes. She has no rales.  Abdominal: Soft. Bowel sounds are normal. She exhibits no distension and no mass. There is no tenderness. There is no rebound and no guarding.  Lymphadenopathy:    She has no cervical adenopathy.  Neurological: She is alert and oriented to person, place, and time. No cranial nerve deficit.  Skin: Skin is warm and dry. Rash noted. She is not diaphoretic. No erythema. No pallor.  Psychiatric: She has a normal mood and affect. Her behavior is normal.   Results for orders placed or performed in visit on 09/19/13  CBC with Differential  Result Value Ref Range   WBC 6.5 4.0 - 10.5 K/uL   RBC 4.60 3.87 - 5.11 MIL/uL   Hemoglobin 12.7 12.0 - 15.0 g/dL   HCT 38.4 36.0 - 46.0 %   MCV 83.5 78.0 - 100.0 fL   MCH 27.6 26.0 - 34.0 pg   MCHC 33.1 30.0 - 36.0 g/dL   RDW 13.9 11.5 - 15.5 %   Platelets 262 150 - 400 K/uL   Neutrophils Relative % 52 43 - 77 %   Neutro Abs 3.4 1.7 - 7.7 K/uL   Lymphocytes Relative 33 12 - 46 %   Lymphs Abs 2.1 0.7 - 4.0 K/uL   Monocytes Relative 12 3 - 12 %   Monocytes  Absolute 0.8 0.1 - 1.0 K/uL   Eosinophils Relative 2 0 - 5 %   Eosinophils Absolute 0.1 0.0 - 0.7 K/uL   Basophils Relative 1 0 - 1 %   Basophils Absolute 0.1 0.0 - 0.1 K/uL   Smear Review Criteria for review not met   COMPLETE METABOLIC PANEL WITH GFR  Result Value Ref Range   Sodium 137 135 - 145 mEq/L   Potassium 4.1 3.5 - 5.3 mEq/L   Chloride 100 96 - 112 mEq/L   CO2 27 19 - 32 mEq/L   Glucose, Bld 90 70 - 99 mg/dL   BUN 9 6 - 23 mg/dL   Creat 0.66 0.50 - 1.10 mg/dL  Total Bilirubin 0.4 0.2 - 1.2 mg/dL   Alkaline Phosphatase 91 39 - 117 U/L   AST 19 0 - 37 U/L   ALT 18 0 - 35 U/L   Total Protein 7.1 6.0 - 8.3 g/dL   Albumin 4.5 3.5 - 5.2 g/dL   Calcium 9.8 8.4 - 10.5 mg/dL   GFR, Est African American >89 mL/min   GFR, Est Non African American >89 mL/min  Hemoglobin A1c  Result Value Ref Range   Hgb A1c MFr Bld 5.7 (H) <5.7 %   Mean Plasma Glucose 117 (H) <117 mg/dL  Lipid panel  Result Value Ref Range   Cholesterol 185 0 - 200 mg/dL   Triglycerides 106 <150 mg/dL   HDL 67 >39 mg/dL   Total CHOL/HDL Ratio 2.8 Ratio   VLDL 21 0 - 40 mg/dL   LDL Cholesterol 97 0 - 99 mg/dL       Assessment & Plan:  Pure hypercholesterolemia - Plan: Lipid panel  Other abnormal glucose - Plan: CBC with Differential, COMPLETE METABOLIC PANEL WITH GFR, Hemoglobin A1c  Chronic arthralgias of knees and hips - Plan: Ambulatory referral to Rheumatology  Other malaise and fatigue  Contact dermatitis  Anxiety and depression  Insomnia  Other allergic rhinitis   1. Hypercholesterolemia: controlled; obtain labs; continue current medications. 2.  Glucose Intolerance: stable/improved; recommend weight loss, exercise, low-carb and low-sugar food intake. 3.  Arthralgias: New onset in past five months; refer to rheumatology to rule out autoimmune process. 4.  Malaise and fatigue:  New.  Associated with arthralgias which cause insomnia.   Refer to rheumatology. If persists, refer for sleep  study. 5.  Contact dermatitis: New. Rx for Triamcinolone ointment provided. 6. Anxiety and depression:  Stable; refill of Cymbalta provided. 7. Allergic Rhinitis: stable; refill of Flonase provided.   Meds ordered this encounter  Medications  . triamcinolone ointment (KENALOG) 0.1 %    Sig: Apply 1 application topically 2 (two) times daily.    Dispense:  30 g    Refill:  0  . DULoxetine (CYMBALTA) 60 MG capsule    Sig: Take 2 capsules (120 mg total) by mouth daily.    Dispense:  180 capsule    Refill:  1  . fluticasone (FLONASE) 50 MCG/ACT nasal spray    Sig: Place 2 sprays into both nostrils daily.    Dispense:  16 g    Refill:  11    Return in about 6 months (around 03/22/2014) for complete physical examiniation.    Reginia Forts, M.D.  [facility] [address] [place], [place]  [address] [phone] phone [phone] fax

## 2013-09-19 NOTE — Patient Instructions (Signed)
1.  HOLD SIMVASTATIN FOR THE NEXT 2-3 MONTHS.   2.  IF YOU HAVE NOT RECEIVED NOTIFICATION REGARDING YOUR APPOINTMENT WITH RHEUMATOLOGY, CALL OFFICE.

## 2013-09-22 ENCOUNTER — Encounter: Payer: Self-pay | Admitting: Family Medicine

## 2013-10-06 ENCOUNTER — Other Ambulatory Visit: Payer: Self-pay | Admitting: Family Medicine

## 2013-10-13 ENCOUNTER — Other Ambulatory Visit: Payer: Self-pay | Admitting: Family Medicine

## 2014-03-22 ENCOUNTER — Ambulatory Visit (INDEPENDENT_AMBULATORY_CARE_PROVIDER_SITE_OTHER): Payer: BC Managed Care – PPO | Admitting: Family Medicine

## 2014-03-22 ENCOUNTER — Encounter: Payer: Self-pay | Admitting: Family Medicine

## 2014-03-22 VITALS — BP 133/72 | HR 93 | Temp 98.8°F | Resp 16 | Ht 64.75 in | Wt 186.6 lb

## 2014-03-22 DIAGNOSIS — E785 Hyperlipidemia, unspecified: Secondary | ICD-10-CM

## 2014-03-22 DIAGNOSIS — E669 Obesity, unspecified: Secondary | ICD-10-CM

## 2014-03-22 DIAGNOSIS — M159 Polyosteoarthritis, unspecified: Secondary | ICD-10-CM

## 2014-03-22 DIAGNOSIS — M150 Primary generalized (osteo)arthritis: Secondary | ICD-10-CM

## 2014-03-22 DIAGNOSIS — I10 Essential (primary) hypertension: Secondary | ICD-10-CM

## 2014-03-22 DIAGNOSIS — Z01419 Encounter for gynecological examination (general) (routine) without abnormal findings: Secondary | ICD-10-CM

## 2014-03-22 DIAGNOSIS — E66811 Obesity, class 1: Secondary | ICD-10-CM

## 2014-03-22 DIAGNOSIS — Z0000 Encounter for general adult medical examination without abnormal findings: Secondary | ICD-10-CM

## 2014-03-22 DIAGNOSIS — J301 Allergic rhinitis due to pollen: Secondary | ICD-10-CM

## 2014-03-22 DIAGNOSIS — F32A Depression, unspecified: Secondary | ICD-10-CM

## 2014-03-22 DIAGNOSIS — R7302 Impaired glucose tolerance (oral): Secondary | ICD-10-CM

## 2014-03-22 DIAGNOSIS — F329 Major depressive disorder, single episode, unspecified: Secondary | ICD-10-CM

## 2014-03-22 DIAGNOSIS — F418 Other specified anxiety disorders: Secondary | ICD-10-CM

## 2014-03-22 DIAGNOSIS — F419 Anxiety disorder, unspecified: Secondary | ICD-10-CM

## 2014-03-22 DIAGNOSIS — F5231 Female orgasmic disorder: Secondary | ICD-10-CM

## 2014-03-22 LAB — POCT URINALYSIS DIPSTICK
Bilirubin, UA: NEGATIVE
Glucose, UA: NEGATIVE
KETONES UA: NEGATIVE
Leukocytes, UA: NEGATIVE
Nitrite, UA: NEGATIVE
Protein, UA: NEGATIVE
Spec Grav, UA: <=1.005
Urobilinogen, UA: 0.2
pH, UA: 7

## 2014-03-22 NOTE — Patient Instructions (Signed)

## 2014-03-22 NOTE — Progress Notes (Signed)
Subjective:    Patient ID: Karina Sampson, female    DOB: 10-22-1960, 53 y.o.   MRN: [id number]  03/22/2014  Annual Exam   HPI This 53 y.o. female presents for Complete Physical Exam.  Last physical: 02/28/2013 Pap smear:  11/04/2011 Mammogram: 04/14/2013 Colonoscopy:  04/21/2013.  Repeat in ten years. Bone density:  never TDAP: 09/05/2010 Pneumovax:  never Zostavax: never Influenza: receives at work.  01/24/2014. Eye exam:  2015.  No g/c.  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 Dental exam:  Every six months.   Arthralgias/myalgias:  S/p rheumatology evaluation by Dr. Judith Blonder.  Xrays negative for erosive disease.  Labs negative for autoimmune process.  Rx for Celebrex provided; recommend follow-up in 4-6 months.  Elpidio Anis, PA-C also evaluated patient.   No morning pain now with Celebrex.  No side effects to medication.  Due for renal panel.  Feeling much better on Celebrex. Has not been taking statin since last visit.  Hypercholesterolemia:  Stopped statin at last visit;  Not sure if helped stopping statin.    Inability to reach orgasm: concerned that medication may be interfering with patient to reach orgasm.  In a new relationship; very excited about relationship; not able to reach orgasm.      Review of Systems  Constitutional: Negative for fever, chills, diaphoresis, activity change, appetite change, fatigue and unexpected weight change.  HENT: Negative for congestion, dental problem, drooling, ear discharge, ear pain, facial swelling, hearing loss, mouth sores, nosebleeds, postnasal drip, rhinorrhea, sinus pressure, sneezing, sore throat, tinnitus, trouble swallowing and voice change.   Eyes: Negative for photophobia, pain, discharge, redness, itching and visual disturbance.  Respiratory: Negative for apnea, cough, choking, chest tightness, shortness of breath, wheezing and stridor.   Cardiovascular: Negative for chest pain, palpitations and leg swelling.    Gastrointestinal: Negative for nausea, vomiting, abdominal pain, diarrhea, constipation, blood in stool, abdominal distention, anal bleeding and rectal pain.  Endocrine: Negative for cold intolerance, heat intolerance, polydipsia, polyphagia and polyuria.  Genitourinary: Negative for dysuria, urgency, frequency, hematuria, flank pain, decreased urine volume, vaginal bleeding, vaginal discharge, enuresis, difficulty urinating, genital sores, vaginal pain, menstrual problem, pelvic pain and dyspareunia.  Musculoskeletal: Positive for arthralgias. Negative for myalgias, back pain, joint swelling, gait problem, neck pain and neck stiffness.  Skin: Negative for color change, pallor, rash and wound.  Allergic/Immunologic: Negative for environmental allergies, food allergies and immunocompromised state.  Neurological: Negative for dizziness, tremors, seizures, syncope, facial asymmetry, speech difficulty, weakness, light-headedness, numbness and headaches.  Hematological: Negative for adenopathy. Does not bruise/bleed easily.  Psychiatric/Behavioral: Negative for suicidal ideas, hallucinations, behavioral problems, confusion, sleep disturbance, self-injury, dysphoric mood, decreased concentration and agitation. The patient is not nervous/anxious and is not hyperactive.     Past Medical History  Diagnosis Date  . Allergy   . Depression   . Anxiety   . Hypertension   . Heart murmur   . Headache(784.0)   . Breast nodule     Right  . Unspecified disease of nail   . Symptomatic menopausal or female climacteric states   . Other B-complex deficiencies   . Ulcer disease   . Arthropathy, unspecified, site unspecified   . SVT (supraventricular tachycardia)   . Anemia, unspecified   . Pure hypercholesterolemia   . Allergic rhinitis, cause unspecified   . Obesity, unspecified   . Tobacco use disorder   . Unspecified vitamin D deficiency    Past Surgical History  Procedure Laterality Date  . Eye  surgery  2001  LASIK- BOTH  . Electrophysiology study  1997  . Wisdom teeth extractions    . Svt surgery      with ablation   Allergies  Allergen Reactions  . Penicillins Diarrhea and Nausea And Vomiting   Current Outpatient Prescriptions  Medication Sig Dispense Refill  . ALPRAZolam (XANAX) 0.5 MG tablet Take 1 tablet (0.5 mg total) by mouth at bedtime as needed for sleep. 30 tablet 1  . aspirin 81 MG tablet Take 81 mg by mouth daily.    . B Complex Vitamins (VITAMIN B COMPLEX PO) Take by mouth daily.    . celecoxib (CELEBREX) 200 MG capsule Take 200 mg by mouth daily.    . Cholecalciferol (VITAMIN D3 PO) Take 4,000 Units by mouth daily.    . DULoxetine (CYMBALTA) 60 MG capsule Take 2 capsules (120 mg total) by mouth daily. 180 capsule 1  . fluticasone (FLONASE) 50 MCG/ACT nasal spray Place 2 sprays into both nostrils daily. 16 g 11  . Omega-3 Fatty Acids (FISH OIL CONCENTRATE) 1000 MG CAPS Take 1,000 mg by mouth daily.    . simvastatin (ZOCOR) 20 MG tablet Take 1 tablet (20 mg total) by mouth every evening. 90 tablet 3  . triamcinolone ointment (KENALOG) 0.1 % Apply 1 application topically 2 (two) times daily. 30 g 0   No current facility-administered medications for this visit.       Objective:    BP 133/72 mmHg  Pulse 93  Temp(Src) 98.8 F (37.1 C) (Oral)  Resp 16  Ht 5' 4.75" (1.645 m)  Wt 186 lb 9.6 oz (84.641 kg)  BMI 31.28 kg/m2  SpO2 97% Physical Exam  Constitutional: She is oriented to person, place, and time. She appears well-developed and well-nourished. No distress.  HENT:  Head: Normocephalic and atraumatic.  Right Ear: External ear normal.  Left Ear: External ear normal.  Nose: Nose normal.  Mouth/Throat: Oropharynx is clear and moist.  Eyes: Conjunctivae and EOM are normal. Pupils are equal, round, and reactive to light.  Neck: Normal range of motion and full passive range of motion without pain. Neck supple. No JVD present. Carotid bruit is not  present. No thyromegaly present.  Cardiovascular: Normal rate, regular rhythm and normal heart sounds.  Exam reveals no gallop and no friction rub.   No murmur heard. Pulmonary/Chest: Effort normal and breath sounds normal. She has no wheezes. She has no rales. Right breast exhibits no inverted nipple, no mass, no nipple discharge, no skin change and no tenderness. Left breast exhibits no inverted nipple, no mass, no nipple discharge, no skin change and no tenderness. Breasts are symmetrical.  Abdominal: Soft. Bowel sounds are normal. She exhibits no distension and no mass. There is no tenderness. There is no rebound and no guarding.  Genitourinary: Vagina normal and uterus normal. There is no rash, tenderness, lesion or injury on the right labia. There is no rash, tenderness, lesion or injury on the left labia. Cervix exhibits no motion tenderness, no discharge and no friability. Right adnexum displays no mass, no tenderness and no fullness. Left adnexum displays no mass, no tenderness and no fullness.  Musculoskeletal:       Right shoulder: Normal.       Left shoulder: Normal.       Cervical back: Normal.  Lymphadenopathy:    She has no cervical adenopathy.  Neurological: She is alert and oriented to person, place, and time. She has normal reflexes. No cranial nerve deficit. She exhibits normal muscle tone.  Coordination normal.  Skin: Skin is warm and dry. No rash noted. She is not diaphoretic. No erythema. No pallor.  Psychiatric: She has a normal mood and affect. Her behavior is normal. Judgment and thought content normal.  Nursing note and vitals reviewed.       Assessment & Plan:   1. Routine general medical examination at a health care facility   2. Essential hypertension   3. Hyperlipidemia   4. Glucose intolerance (impaired glucose tolerance)   5. Encounter for routine gynecological examination   6. Anxiety and depression   7. Obesity (BMI 30.0-34.9)   8. 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 Complete Physical Examination: anticipatory guidance --- weight loss, exercise, 3 servings of dairy daily.  Pap smear obtained; mammogram UTD.  Colonoscopy UTD. Immunizations UTD.   2.  Gynecological exam: pap smear obtained; mammogram UTD.  Post-menopausal.  Same sex partners. 3.  Hyperlipidemia: uncontrolled due to non-compliance with statin; obtain labs.  May need to restart statin. 4.  Glucose intolerance: stable; recent weight loss; obtain labs. 5.  Anxiety and depression: stable; major stressors at work and with previous relationship issues.  Decrease Cymbalta to 60 mg daily to see if improves delayed orgasm.  May consider HOLDING Cymbalta for 1-2 months to see if orgasms improve.  Obtain labs. 6.  Inhibited orgasm: New.  Decrease and possibly HOLD Cymbalta.  Can consider Effexor if symptom secondary to Cymbalta.  Menopause state may also be contributing.  Discussed risk and benefits of HRT. 7.  Obesity: with recent weight loss. Recommend continued exercise and dietary modification.    Meds ordered this encounter  Medications  . celecoxib (CELEBREX) 200 MG capsule    Sig: Take 200 mg by mouth daily.    Return in about 6 months (around 09/20/2014).    Nilda SimmerKristi Haven Pylant, M.D.  [facility]  [place] [address] [place], [place]  [id number] [phone] phone [phone] fax

## 2014-03-23 LAB — CBC WITH DIFFERENTIAL/PLATELET
BASOS PCT: 1 % (ref 0–1)
Basophils Absolute: 0.1 10*3/uL (ref 0.0–0.1)
Eosinophils Absolute: 0.2 10*3/uL (ref 0.0–0.7)
Eosinophils Relative: 3 % (ref 0–5)
HEMATOCRIT: 39.8 % (ref 36.0–46.0)
HEMOGLOBIN: 13.4 g/dL (ref 12.0–15.0)
LYMPHS ABS: 1.7 10*3/uL (ref 0.7–4.0)
LYMPHS PCT: 29 % (ref 12–46)
MCH: 28.3 pg (ref 26.0–34.0)
MCHC: 33.7 g/dL (ref 30.0–36.0)
MCV: 84.1 fL (ref 78.0–100.0)
MONOS PCT: 10 % (ref 3–12)
Monocytes Absolute: 0.6 10*3/uL (ref 0.1–1.0)
NEUTROS ABS: 3.4 10*3/uL (ref 1.7–7.7)
NEUTROS PCT: 57 % (ref 43–77)
Platelets: 278 10*3/uL (ref 150–400)
RBC: 4.73 MIL/uL (ref 3.87–5.11)
RDW: 14 % (ref 11.5–15.5)
WBC: 5.9 10*3/uL (ref 4.0–10.5)

## 2014-03-23 LAB — COMPLETE METABOLIC PANEL WITH GFR
ALBUMIN: 4.3 g/dL (ref 3.5–5.2)
ALK PHOS: 90 U/L (ref 39–117)
ALT: 21 U/L (ref 0–35)
AST: 18 U/L (ref 0–37)
BUN: 11 mg/dL (ref 6–23)
CALCIUM: 9.6 mg/dL (ref 8.4–10.5)
CHLORIDE: 102 meq/L (ref 96–112)
CO2: 24 mEq/L (ref 19–32)
Creat: 0.64 mg/dL (ref 0.50–1.10)
GFR, Est African American: >89 mL/min
GLUCOSE: 101 mg/dL — AB (ref 70–99)
POTASSIUM: 4.4 meq/L (ref 3.5–5.3)
SODIUM: 138 meq/L (ref 135–145)
TOTAL PROTEIN: 7 g/dL (ref 6.0–8.3)
Total Bilirubin: 0.5 mg/dL (ref 0.2–1.2)

## 2014-03-23 LAB — LIPID PANEL
Cholesterol: 245 mg/dL — ABNORMAL HIGH (ref 0–200)
HDL: 72 mg/dL (ref >39)
LDL Cholesterol: 155 mg/dL — ABNORMAL HIGH (ref 0–99)
TRIGLYCERIDES: 89 mg/dL (ref <150)
Total CHOL/HDL Ratio: 3.4 Ratio
VLDL: 18 mg/dL (ref 0–40)

## 2014-03-23 LAB — VITAMIN B12: Vitamin B-12: 669 pg/mL (ref 211–911)

## 2014-03-23 LAB — HEMOGLOBIN A1C
Hgb A1c MFr Bld: 5.7 % — ABNORMAL HIGH (ref <5.7)
Mean Plasma Glucose: 117 mg/dL — ABNORMAL HIGH (ref <117)

## 2014-03-23 LAB — VITAMIN D 25 HYDROXY (VIT D DEFICIENCY, FRACTURES): Vit D, 25-Hydroxy: 65 ng/mL (ref 30–89)

## 2014-03-24 LAB — PAP IG AND HPV HIGH-RISK: HPV DNA HIGH RISK: NOT DETECTED

## 2014-03-26 MED ORDER — FLUTICASONE PROPIONATE 50 MCG/ACT NA SUSP: 2.0000 | Freq: Every day | NASAL | Status: DC

## 2014-03-26 MED ORDER — DULOXETINE HCL 60 MG PO CPEP: 120.0000 mg | ORAL_CAPSULE | Freq: Every day | ORAL | Status: DC

## 2014-04-17 ENCOUNTER — Ambulatory Visit: Payer: Self-pay | Admitting: Family Medicine

## 2014-04-18 ENCOUNTER — Other Ambulatory Visit: Payer: Self-pay | Admitting: Family Medicine

## 2014-05-25 ENCOUNTER — Ambulatory Visit (INDEPENDENT_AMBULATORY_CARE_PROVIDER_SITE_OTHER): Payer: BLUE CROSS/BLUE SHIELD

## 2014-05-25 ENCOUNTER — Ambulatory Visit: Payer: Self-pay | Admitting: Family Medicine

## 2014-05-25 ENCOUNTER — Ambulatory Visit (INDEPENDENT_AMBULATORY_CARE_PROVIDER_SITE_OTHER): Payer: BLUE CROSS/BLUE SHIELD | Admitting: Family Medicine

## 2014-05-25 VITALS — BP 139/95 | HR 98 | Temp 99.2°F | Resp 18 | Wt 184.0 lb

## 2014-05-25 DIAGNOSIS — M542 Cervicalgia: Secondary | ICD-10-CM

## 2014-05-25 DIAGNOSIS — M25511 Pain in right shoulder: Secondary | ICD-10-CM

## 2014-05-25 MED ORDER — METHOCARBAMOL 500 MG PO TABS: 500.0000 mg | ORAL_TABLET | Freq: Every evening | ORAL | Status: DC | PRN

## 2014-05-25 MED ORDER — TRAMADOL HCL 50 MG PO TABS: 50.0000 mg | ORAL_TABLET | Freq: Four times a day (QID) | ORAL | Status: DC | PRN

## 2014-05-25 MED ORDER — PREDNISONE 20 MG PO TABS: ORAL_TABLET | ORAL | Status: DC

## 2014-05-25 NOTE — Patient Instructions (Signed)
1.  CALL IN 2 WEEKS IF NO IMPROVEMENT.

## 2014-05-25 NOTE — Progress Notes (Addendum)
Subjective:  This chart was scribed for Nilda Simmer, MD by Elveria Rising, Medial Scribe. This patient was seen in room 9 and the patient's care was started at 6:42 PM.    Patient ID: Karina Sampson, female    DOB: 1960-09-19, 54 y.o.   MRN: [id number]  05/25/2014  Shoulder Pain   HPI HPI Comments: Karina Sampson is a 54 y.o. female who presents to the [facility] with a right shoulder injury incurred 3-4 weeks ago. Patient reports falling into the tub while she was cleaning and landing directly on her right shoulder.  Patient denies head injury or loss of consciousness. Patient reports worsening pain since the injury, despite treatment. Patient reports pain in her right neck extending into her right shoulder blade initially. Patient reports treatment with ice every 20 minutes, ibuprofen and Celebrex after the injury. Patient reports later development of radiation into the medial aspect of her right arm and into her hand for one week now. Patient describes aching pain in her arm. Patient reports numbing sensation in to her right hand in 4th and 5th digits. Patient is right hand dominant. Patient reports that she has been able to work, states she "has been killing the pain with ibuprofen."   Review of Systems  Constitutional: Negative for fever, chills, diaphoresis and fatigue.  Gastrointestinal: Negative for nausea and vomiting.  Musculoskeletal: Positive for myalgias and neck pain.  Skin: Negative for color change, pallor and wound.  Neurological: Positive for numbness. Negative for dizziness, tremors, seizures, syncope, facial asymmetry, speech difficulty, weakness, light-headedness and headaches.    Past Medical History  Diagnosis Date   Allergy    Depression    Anxiety    Hypertension    Heart murmur    Headache(784.0)    Breast nodule     Right   Unspecified disease of nail    Symptomatic menopausal or female climacteric states    Other  B-complex deficiencies    Ulcer disease    Arthropathy, unspecified, site unspecified    SVT (supraventricular tachycardia)    Anemia, unspecified    Pure hypercholesterolemia    Allergic rhinitis, cause unspecified    Obesity, unspecified    Tobacco use disorder    Unspecified vitamin D deficiency    Past Surgical History  Procedure Laterality Date   Eye surgery  2001    LASIK- BOTH   Electrophysiology study  1997   Wisdom teeth extractions     Svt surgery      with ablation   Allergies  Allergen Reactions   Penicillins Diarrhea and Nausea And Vomiting   Current Outpatient Prescriptions  Medication Sig Dispense Refill   aspirin 81 MG tablet Take 81 mg by mouth daily.     B Complex Vitamins (VITAMIN B COMPLEX PO) Take by mouth daily.     celecoxib (CELEBREX) 200 MG capsule Take 200 mg by mouth daily.     Cholecalciferol (VITAMIN D3 PO) Take 4,000 Units by mouth daily.     DULoxetine (CYMBALTA) 60 MG capsule Take 2 capsules (120 mg total) by mouth daily. (Patient taking differently: Take 60 mg by mouth daily. ) 180 capsule 1   fluticasone (FLONASE) 50 MCG/ACT nasal spray Place 2 sprays into both nostrils daily. 16 g 11   Omega-3 Fatty Acids (FISH OIL CONCENTRATE) 1000 MG CAPS Take 1,000 mg by mouth daily.     ALPRAZolam (XANAX) 0.5 MG tablet Take 1 tablet (0.5 mg total) by mouth  at bedtime as needed for sleep. (Patient not taking: Reported on 05/25/2014) 30 tablet 1   methocarbamol (ROBAXIN) 500 MG tablet Take 1-2 tablets (500-1,000 mg total) by mouth at bedtime as needed for muscle spasms. 45 tablet 0   predniSONE (DELTASONE) 20 MG tablet Three tablets daily x 2 days then two tablets daily x 5 days then one tablet daily x 5 days 21 tablet 0   traMADol (ULTRAM) 50 MG tablet Take 1 tablet (50 mg total) by mouth every 6 (six) hours as needed. 45 tablet 0   No current facility-administered medications for this visit.       Objective:    BP 139/95 mmHg    Pulse 98   Temp(Src) 99.2 F (37.3 C) (Oral)   Resp 18   Wt 184 lb (83.462 kg)   SpO2 98% Physical Exam  Constitutional: She is oriented to person, place, and time. She appears well-developed and well-nourished. No distress.  HENT:  Head: Normocephalic and atraumatic.  Eyes: EOM are normal.  Neck: Neck supple. No tracheal deviation present.  Cardiovascular: Normal rate, regular rhythm and normal heart sounds.   No murmur heard. Pulmonary/Chest: Effort normal and breath sounds normal. No respiratory distress. She has no wheezes. She has no rales.  Musculoskeletal: She exhibits tenderness.       Cervical back: She exhibits tenderness, pain and spasm. She exhibits normal range of motion, no bony tenderness, no swelling, no edema and normal pulse.  CERVICAL SPINE:  No midline tenderness  in cervical or thoracic spines. + R Paraspinal tenderness and right scapular tenderness. R SHOULDER: full ROM without pain or limitation; +TTP deltoid region on R.  Empty can negative; cross over negative.  Motor 5/5 BUE.  Grip 5/5 BUE.  Neurological: She is alert and oriented to person, place, and time. She has normal reflexes. No cranial nerve deficit. She exhibits normal muscle tone. Coordination normal.  Skin: Skin is warm and dry. She is not diaphoretic. No erythema.  Psychiatric: She has a normal mood and affect. Her behavior is normal. Judgment and thought content normal.  Nursing note and vitals reviewed.   [facility] reading (PRIMARY) by  Dr. Katrinka BlazingSmith.  R SHOULDER: NAD; CERVICAL NECK: DDD MULTILEVEL with spurring.      Assessment & Plan:   1. Neck pain   2. Pain in joint, shoulder region, right     -New following fall/trauma. -s/p CT cervical spine due to concern of C2 fracture; CT neck negative for acute process. -Rx for Prednisone, Robaxin, Tramadol provided. -continue heat bid for 15-20 minutes each session. -Continue home exercise program. -Call in 1-2 weeks; if no improvement in symptoms, will  refer to ortho. -Avoid heavy lifting more than 10 pounds for next two weeks.    Meds ordered this encounter  Medications   predniSONE (DELTASONE) 20 MG tablet    Sig: Three tablets daily x 2 days then two tablets daily x 5 days then one tablet daily x 5 days    Dispense:  21 tablet    Refill:  0   methocarbamol (ROBAXIN) 500 MG tablet    Sig: Take 1-2 tablets (500-1,000 mg total) by mouth at bedtime as needed for muscle spasms.    Dispense:  45 tablet    Refill:  0   traMADol (ULTRAM) 50 MG tablet    Sig: Take 1 tablet (50 mg total) by mouth every 6 (six) hours as needed.    Dispense:  45 tablet    Refill:  0    No Follow-up on file.   I personally performed the services described in this documentation, which was scribed in my presence. The recorded information has been reviewed and considered.   Kristi Paulita Fujita, M.D. [facility] [address] [place], [place]  [address] [phone] phone [phone] fax

## 2014-06-05 ENCOUNTER — Telehealth: Payer: Self-pay

## 2014-06-05 DIAGNOSIS — S161XXA Strain of muscle, fascia and tendon at neck level, initial encounter: Secondary | ICD-10-CM

## 2014-06-05 NOTE — Telephone Encounter (Signed)
Pt states she was treated last week by Dr Katrinka BlazingSmith and was prescribed prednisone and pain medicine. Pt states she is still in pain and wants to know what she should do

## 2014-06-06 ENCOUNTER — Encounter: Payer: Self-pay | Admitting: Family Medicine

## 2014-06-06 NOTE — Telephone Encounter (Signed)
Spoke to pt- advised referral had been made.

## 2014-06-06 NOTE — Telephone Encounter (Signed)
Spoke with pt, she states her shoulder is not getting better and now the pain is going down the back of her arm and now in the chest area. She states initially it got better with the medication but now she is unable to work because of the pain. Pt wants to know the next step. Please advise.

## 2014-06-06 NOTE — Telephone Encounter (Signed)
Recommend evaluation by ortho; I will make referral.

## 2014-06-07 MED ORDER — PREDNISONE 20 MG PO TABS: ORAL_TABLET | ORAL | Status: DC

## 2014-06-07 NOTE — Telephone Encounter (Signed)
Call --- I am happy to prescribe her hydrocodone for pain but she is not able to work with hydrocodone in her system.  I am also happy to prescribe another round of prednisone to take while we are waiting. Would she like an rx for hydrocodone to better manage her pain until she can she ortho?  I have sent refill of Prednisone to pharmacy.

## 2014-06-07 NOTE — Telephone Encounter (Signed)
Spoke with pt, she has enough Tramadol to last her until her appt but she states it is barely taking the edge off. She doesn't know if she needs another round of Prednisone or a different pain medication. She wants to go back to work but the pain is too much. Please advise.

## 2014-06-07 NOTE — Telephone Encounter (Signed)
Patient wants to ask what she needs to do in the mean time since [facility] on 06/12/14. She wants to know what her next step is? I suggested an excuse note from Dr. Katrinka BlazingSmith. or to come into office to be treated still if she has gotten worse. Please advise. She has been out of work since taking the pain medication.   Best: [phone]

## 2014-06-08 NOTE — Telephone Encounter (Signed)
Left message for pt to call back  °

## 2014-06-12 NOTE — Telephone Encounter (Signed)
Left message for pt to call back  °

## 2014-06-13 NOTE — Telephone Encounter (Signed)
Tried to reach pt again. Left message for pt to call back.

## 2014-06-13 NOTE — Telephone Encounter (Signed)
Spoke with pt, she does want to try the Hydrocodone, she has already picked up the Rx for Prednisone. She still is not better with controlling the pain. Please advise.

## 2014-06-13 NOTE — Telephone Encounter (Signed)
I am happy to write prescription for Hydrocodone for pain; pt will need to pick up rx from office. I know that [facility] had [facility]; if [place] not [place] [facility] [place] at [facility] office, [facility] may be willing to write hydrocodone for her.  If she does want me to write hydrocodone, rx will be ready on Wednesday, 06/14/14.

## 2014-06-13 NOTE — Telephone Encounter (Signed)
Pt has someone that will be picking up for her tomorrow. She would like it to be ready by noon when they stop in.

## 2014-06-14 MED ORDER — HYDROCODONE-ACETAMINOPHEN 5-325 MG PO TABS: 1.0000 | ORAL_TABLET | Freq: Four times a day (QID) | ORAL | Status: DC | PRN

## 2014-06-14 NOTE — Telephone Encounter (Signed)
Pt advised ready for pick up.

## 2014-06-14 NOTE — Telephone Encounter (Signed)
Rx ready for pick up at 102. 

## 2014-07-10 ENCOUNTER — Other Ambulatory Visit: Payer: Self-pay | Admitting: Family Medicine

## 2014-07-25 ENCOUNTER — Telehealth: Payer: Self-pay

## 2014-07-25 DIAGNOSIS — M5090 Cervical disc disorder, unspecified, unspecified cervical region: Secondary | ICD-10-CM

## 2014-07-25 NOTE — Telephone Encounter (Signed)
PT STATES DR Katrinka BlazingSMITH HAD PREVIOUSLY REFERRED HER TO A PROVIDER AT [facility], BUT HAS NOT BEEN ABLE TO GET AN APPOINTMENT WITH THAT DOCTOR. PT IS REQUESTING A REFERRAL TO DIFFERENT ORTHOPEDIC PLEASE CALL PT TO ADVISE

## 2014-07-25 NOTE — Telephone Encounter (Signed)
OK to refer to different ortho group.  Referral placed; please advise patient.

## 2014-07-25 NOTE — Telephone Encounter (Signed)
Called pt to let her know. She will call back with her otho preference.

## 2014-07-25 NOTE — Telephone Encounter (Signed)
Can we refer to a different ortho?

## 2014-08-13 ENCOUNTER — Other Ambulatory Visit: Payer: Self-pay | Admitting: Family Medicine

## 2014-09-04 ENCOUNTER — Ambulatory Visit
Admit: 2014-09-04 | Disposition: A | Discharge status: Home / Self Care | Payer: Self-pay | Attending: Orthopedic Surgery | Admitting: Orthopedic Surgery

## 2014-09-25 ENCOUNTER — Encounter: Payer: Self-pay | Admitting: Family Medicine

## 2014-09-25 ENCOUNTER — Ambulatory Visit (INDEPENDENT_AMBULATORY_CARE_PROVIDER_SITE_OTHER): Payer: BLUE CROSS/BLUE SHIELD | Admitting: Family Medicine

## 2014-09-25 VITALS — BP 136/82 | HR 79 | Temp 98.5°F | Resp 16 | Ht 64.75 in | Wt 176.6 lb

## 2014-09-25 DIAGNOSIS — F329 Major depressive disorder, single episode, unspecified: Secondary | ICD-10-CM

## 2014-09-25 DIAGNOSIS — F418 Other specified anxiety disorders: Secondary | ICD-10-CM | POA: Diagnosis not present

## 2014-09-25 DIAGNOSIS — E7800 Pure hypercholesterolemia, unspecified: Secondary | ICD-10-CM

## 2014-09-25 DIAGNOSIS — J301 Allergic rhinitis due to pollen: Secondary | ICD-10-CM | POA: Diagnosis not present

## 2014-09-25 DIAGNOSIS — F419 Anxiety disorder, unspecified: Secondary | ICD-10-CM

## 2014-09-25 DIAGNOSIS — R7302 Impaired glucose tolerance (oral): Secondary | ICD-10-CM

## 2014-09-25 DIAGNOSIS — F32A Depression, unspecified: Secondary | ICD-10-CM

## 2014-09-25 DIAGNOSIS — E663 Overweight: Secondary | ICD-10-CM | POA: Insufficient documentation

## 2014-09-25 MED ORDER — DULOXETINE HCL 60 MG PO CPEP: ORAL_CAPSULE | ORAL | Status: DC

## 2014-09-25 NOTE — Progress Notes (Signed)
Subjective:    Patient ID: Karina Sampson, female    DOB: Mar 13, 1961, 54 y.o.   MRN: [id number]  09/25/2014  Follow-up   HPI This 54 y.o. female presents for six month follow-up:  1. Hypercholesterolemia:  Not taking statin therapy.    2. Glucose Intolerance:  Weight down 23 pounds in past year.  3.  Anxiety and depression:  Decreased Cymbalta to 55m daily at last visit due to delayed orgasm.  Dating for seven months.  Emotionally doing well.    4.  Allergic Rhinitis:  Stable; compliance with Flonase.    5. Obesity: weight down 23 pounds in past year.   More active in the past year.  Now playing pickle ball twice weekly.  Goes hiking on weekends.  Taking dogs for walk; two dogs.  Also walks other dogs in neighborhood.  6.  DDD cervical spine: still working on neck and shoulder; s /p MRI; return visit tomorrow.  Neck is minimal at this time; pain intermittent.  Taking Celebrex daily with improvement. No radiation into arms; no n/t/w.   7. Arthralgias: follow-up with EMarella Chimes PA-C.  To send labs to rheumatology.  No pain medications.  Seeing every six months.     Review of Systems  Constitutional: Negative for fever, chills, diaphoresis and fatigue.  Eyes: Negative for visual disturbance.  Respiratory: Negative for cough and shortness of breath.   Cardiovascular: Negative for chest pain, palpitations and leg swelling.  Gastrointestinal: Negative for nausea, vomiting, abdominal pain, diarrhea and constipation.  Endocrine: Negative for cold intolerance, heat intolerance, polydipsia, polyphagia and polyuria.  Neurological: Negative for dizziness, tremors, seizures, syncope, facial asymmetry, speech difficulty, weakness, light-headedness, numbness and headaches.    Past Medical History  Diagnosis Date  . Allergy   . Depression   . Anxiety   . Hypertension   . Heart murmur   . Headache(784.0)   . Breast nodule     Right  . Unspecified disease of nail   . Symptomatic  menopausal or female climacteric states   . Other B-complex deficiencies   . Ulcer disease   . Arthropathy, unspecified, site unspecified   . SVT (supraventricular tachycardia)   . Anemia, unspecified   . Pure hypercholesterolemia   . Allergic rhinitis, cause unspecified   . Obesity, unspecified   . Tobacco use disorder   . Unspecified vitamin D deficiency    Past Surgical History  Procedure Laterality Date  . Eye surgery  2001    LASIK- BOTH  . Electrophysiology study  1997  . Wisdom teeth extractions    . Svt surgery      with ablation   Allergies  Allergen Reactions  . Penicillins Diarrhea and Nausea And Vomiting   Current Outpatient Prescriptions  Medication Sig Dispense Refill  . aspirin 81 MG tablet Take 81 mg by mouth daily.    . B Complex Vitamins (VITAMIN B COMPLEX PO) Take by mouth daily.    . celecoxib (CELEBREX) 200 MG capsule Take 200 mg by mouth daily.    . Cholecalciferol (VITAMIN D3 PO) Take 4,000 Units by mouth daily.    . DULoxetine (CYMBALTA) 60 MG capsule TAKE 2 CAPSULES (120 MG TOTAL) BY MOUTH DAILY. 60 capsule 1  . fluticasone (FLONASE) 50 MCG/ACT nasal spray Place 2 sprays into both nostrils daily. 16 g 11  . Omega-3 Fatty Acids (FISH OIL CONCENTRATE) 1000 MG CAPS Take 1,000 mg by mouth daily.    .Marland KitchenALPRAZolam (XANAX) 0.5 MG tablet Take  1 tablet (0.5 mg total) by mouth at bedtime as needed for sleep. (Patient not taking: Reported on 05/25/2014) 30 tablet 1   No current facility-administered medications for this visit.       Objective:    BP 136/82 mmHg  Pulse 79  Temp(Src) 98.5 F (36.9 C) (Oral)  Resp 16  Ht 5' 4.75" (1.645 m)  Wt 176 lb 9.6 oz (80.105 kg)  BMI 29.60 kg/m2  SpO2 97% Physical Exam  Constitutional: She is oriented to person, place, and time. She appears well-developed and well-nourished. No distress.  HENT:  Head: Normocephalic and atraumatic.  Right Ear: External ear normal.  Left Ear: External ear normal.  Nose: Nose  normal.  Mouth/Throat: Oropharynx is clear and moist.  Eyes: Conjunctivae and EOM are normal. Pupils are equal, round, and reactive to light.  Neck: Normal range of motion. Neck supple. Carotid bruit is not present. No thyromegaly present.  Cardiovascular: Normal rate, regular rhythm, normal heart sounds and intact distal pulses.  Exam reveals no gallop and no friction rub.   No murmur heard. Pulmonary/Chest: Effort normal and breath sounds normal. She has no wheezes. She has no rales.  Abdominal: Soft. Bowel sounds are normal. She exhibits no distension and no mass. There is no tenderness. There is no rebound and no guarding.  Lymphadenopathy:    She has no cervical adenopathy.  Neurological: She is alert and oriented to person, place, and time. No cranial nerve deficit.  Skin: Skin is warm and dry. No rash noted. She is not diaphoretic. No erythema. No pallor.  Psychiatric: She has a normal mood and affect. Her behavior is normal.   Results for orders placed or performed in visit on 03/22/14  CBC with Differential  Result Value Ref Range   WBC 5.9 4.0 - 10.5 K/uL   RBC 4.73 3.87 - 5.11 MIL/uL   Hemoglobin 13.4 12.0 - 15.0 g/dL   HCT 39.8 36.0 - 46.0 %   MCV 84.1 78.0 - 100.0 fL   MCH 28.3 26.0 - 34.0 pg   MCHC 33.7 30.0 - 36.0 g/dL   RDW 14.0 11.5 - 15.5 %   Platelets 278 150 - 400 K/uL   Neutrophils Relative % 57 43 - 77 %   Neutro Abs 3.4 1.7 - 7.7 K/uL   Lymphocytes Relative 29 12 - 46 %   Lymphs Abs 1.7 0.7 - 4.0 K/uL   Monocytes Relative 10 3 - 12 %   Monocytes Absolute 0.6 0.1 - 1.0 K/uL   Eosinophils Relative 3 0 - 5 %   Eosinophils Absolute 0.2 0.0 - 0.7 K/uL   Basophils Relative 1 0 - 1 %   Basophils Absolute 0.1 0.0 - 0.1 K/uL   Smear Review Criteria for review not met   COMPLETE METABOLIC PANEL WITH GFR  Result Value Ref Range   Sodium 138 135 - 145 mEq/L   Potassium 4.4 3.5 - 5.3 mEq/L   Chloride 102 96 - 112 mEq/L   CO2 24 19 - 32 mEq/L   Glucose, Bld 101 (H)  70 - 99 mg/dL   BUN 11 6 - 23 mg/dL   Creat 0.64 0.50 - 1.10 mg/dL   Total Bilirubin 0.5 0.2 - 1.2 mg/dL   Alkaline Phosphatase 90 39 - 117 U/L   AST 18 0 - 37 U/L   ALT 21 0 - 35 U/L   Total Protein 7.0 6.0 - 8.3 g/dL   Albumin 4.3 3.5 - 5.2 g/dL  Calcium 9.6 8.4 - 10.5 mg/dL   GFR, Est African American >89 mL/min   GFR, Est Non African American >89 mL/min  Hemoglobin A1c  Result Value Ref Range   Hgb A1c MFr Bld 5.7 (H) <5.7 %   Mean Plasma Glucose 117 (H) <117 mg/dL  Lipid panel  Result Value Ref Range   Cholesterol 245 (H) 0 - 200 mg/dL   Triglycerides 89 <150 mg/dL   HDL 72 >39 mg/dL   Total CHOL/HDL Ratio 3.4 Ratio   VLDL 18 0 - 40 mg/dL   LDL Cholesterol 155 (H) 0 - 99 mg/dL  Vit D  25 hydroxy (rtn osteoporosis monitoring)  Result Value Ref Range   Vit D, 25-Hydroxy 65 30 - 89 ng/mL  Vitamin B12  Result Value Ref Range   Vitamin B-12 669 211 - 911 pg/mL  POCT urinalysis dipstick  Result Value Ref Range   Color, UA YELLOW    Clarity, UA CLEAR    Glucose, UA NEG    Bilirubin, UA NEG    Ketones, UA NEG    Spec Grav, UA <=1.005    Blood, UA TRACE    pH, UA 7.0    Protein, UA NEG    Urobilinogen, UA 0.2    Nitrite, UA NEG    Leukocytes, UA Negative   Pap IG and HPV (high risk) DNA detection  Result Value Ref Range   HPV DNA High Risk Not Detected    Specimen adequacy: SEE NOTE    FINAL DIAGNOSIS: SEE NOTE    Cytotechnologist: SEE NOTE        Assessment & Plan:   1. Pure hypercholesterolemia   2. Anxiety and depression   3. Glucose intolerance (impaired glucose tolerance)   4. Allergic rhinitis due to pollen     No orders of the defined types were placed in this encounter.    No Follow-up on file.     Belinda Bringhurst Elayne Guerin, M.D. [facility] [address] [place], [place]  [address] [phone] phone [phone] fax

## 2014-09-26 LAB — CBC WITH DIFFERENTIAL/PLATELET
BASOS ABS: 0.1 10*3/uL (ref 0.0–0.1)
BASOS PCT: 1 % (ref 0–1)
Eosinophils Absolute: 0.2 10*3/uL (ref 0.0–0.7)
Eosinophils Relative: 4 % (ref 0–5)
HEMATOCRIT: 39 % (ref 36.0–46.0)
Hemoglobin: 12.6 g/dL (ref 12.0–15.0)
LYMPHS PCT: 30 % (ref 12–46)
Lymphs Abs: 1.6 10*3/uL (ref 0.7–4.0)
MCH: 28.1 pg (ref 26.0–34.0)
MCHC: 32.3 g/dL (ref 30.0–36.0)
MCV: 86.9 fL (ref 78.0–100.0)
MPV: 10.3 fL (ref 8.6–12.4)
Monocytes Absolute: 0.6 10*3/uL (ref 0.1–1.0)
Monocytes Relative: 11 % (ref 3–12)
Neutro Abs: 2.8 10*3/uL (ref 1.7–7.7)
Neutrophils Relative %: 54 % (ref 43–77)
Platelets: 258 10*3/uL (ref 150–400)
RBC: 4.49 MIL/uL (ref 3.87–5.11)
RDW: 13.4 % (ref 11.5–15.5)
WBC: 5.2 10*3/uL (ref 4.0–10.5)

## 2014-09-26 LAB — COMPREHENSIVE METABOLIC PANEL
ALT: 16 U/L (ref 0–35)
AST: 21 U/L (ref 0–37)
Albumin: 4.1 g/dL (ref 3.5–5.2)
Alkaline Phosphatase: 75 U/L (ref 39–117)
BILIRUBIN TOTAL: 0.6 mg/dL (ref 0.2–1.2)
BUN: 10 mg/dL (ref 6–23)
CO2: 26 mEq/L (ref 19–32)
CREATININE: 0.65 mg/dL (ref 0.50–1.10)
Calcium: 9.4 mg/dL (ref 8.4–10.5)
Chloride: 103 mEq/L (ref 96–112)
GLUCOSE: 102 mg/dL — AB (ref 70–99)
Potassium: 5 mEq/L (ref 3.5–5.3)
Sodium: 140 mEq/L (ref 135–145)
Total Protein: 6.8 g/dL (ref 6.0–8.3)

## 2014-09-26 LAB — HEMOGLOBIN A1C
Hgb A1c MFr Bld: 5.6 % (ref <5.7)
Mean Plasma Glucose: 114 mg/dL (ref <117)

## 2014-09-26 LAB — LIPID PANEL
CHOL/HDL RATIO: 3.5 ratio
Cholesterol: 275 mg/dL — ABNORMAL HIGH (ref 0–200)
HDL: 79 mg/dL (ref >=46)
LDL Cholesterol: 179 mg/dL — ABNORMAL HIGH (ref 0–99)
Triglycerides: 85 mg/dL (ref <150)
VLDL: 17 mg/dL (ref 0–40)

## 2014-09-28 MED ORDER — SIMVASTATIN 20 MG PO TABS: 20.0000 mg | ORAL_TABLET | Freq: Every day | ORAL | Status: DC

## 2014-09-29 ENCOUNTER — Encounter: Payer: Self-pay | Admitting: Family Medicine

## 2014-10-04 ENCOUNTER — Encounter: Payer: Self-pay | Admitting: Physical Therapy

## 2014-10-04 ENCOUNTER — Ambulatory Visit: Payer: BLUE CROSS/BLUE SHIELD | Attending: Orthopedic Surgery | Admitting: Physical Therapy

## 2014-10-04 DIAGNOSIS — M542 Cervicalgia: Secondary | ICD-10-CM | POA: Insufficient documentation

## 2014-10-04 NOTE — Therapy (Signed)
[facility] [address] [place], [place], [address] Phone: [phone]   Fax:  [phone]  Physical Therapy Evaluation  Patient Details  Name: Karina Sampson MRN: [id number] Date of Birth: 07/30/1960 Referring Provider:  Venita LickBrooks, Dahari, MD  Encounter Date: 10/04/2014      PT End of Session - 10/04/14 1708    Visit Number 1   Number of Visits 13   Date for PT Re-Evaluation 11/16/14   PT Start Time 0405   PT Stop Time 0505   PT Time Calculation (min) 60 min   Activity Tolerance Patient tolerated treatment well   Behavior During Therapy [facility] for tasks assessed/performed      Past Medical History  Diagnosis Date  . Allergy   . Depression   . Anxiety   . Hypertension   . Heart murmur   . Headache(784.0)   . Breast nodule     Right  . Unspecified disease of nail   . Symptomatic menopausal or female climacteric states   . Other B-complex deficiencies   . Ulcer disease   . Arthropathy, unspecified, site unspecified   . SVT (supraventricular tachycardia)   . Anemia, unspecified   . Pure hypercholesterolemia   . Allergic rhinitis, cause unspecified   . Obesity, unspecified   . Tobacco use disorder   . Unspecified vitamin D deficiency     Past Surgical History  Procedure Laterality Date  . Eye surgery  2001    LASIK- BOTH  . Electrophysiology study  1997  . Wisdom teeth extractions    . Svt surgery      with ablation    There were no vitals filed for this visit.  Visit Diagnosis:  Neck pain - Plan: PT plan of care cert/re-cert      Subjective Assessment - 10/04/14 1617    Subjective Patient is having recent cervical pain that is intermittent.            [facility] PT Assessment - 10/04/14 1620    Sensation   Light Touch Impaired by gross assessment   Hot/Cold Appears Intact   ROM / Strength   AROM / PROM / Strength AROM  rot. 50 right, 60 left, SB left and right 30, ext 40, flex50   AROM   Overall AROM Comments --  pain with AROM all motions   Special Tests   Cervical Tests Spurling's;Dictraction  negative sprulings, negative distractrion   Spurling's   Findings Negative   Distraction Test   Findngs Negative                           PT Education - 10/04/14 1708    Education provided Yes   Person(s) Educated Patient   Methods Explanation;Demonstration   Comprehension Verbalized understanding;Returned demonstration;Verbal cues required             PT Long Term Goals - 10/04/14 1713    PT LONG TERM GOAL #1   Title Patient will increase cervical ROM SB to [facility] to improve posture and be able to perform functional abilities   Status New   PT LONG TERM GOAL #2   Title Patient will improve cervical strength to be able to decrease pain to lift, and carry items without pain   Status New   PT LONG TERM GOAL #3   Title Patient will be independent with home exercise program for self-management of cervical symptoms/ difficulty.  Status New   PT LONG TERM GOAL #4   Title Pateint will improve cervical ROM to be able to drive and turn her head wihtout pain.    Status New               Plan - 10/04/14 1718    Clinical Impression Statement Patient is 54 yr old female with onset of neck pain that is imtermittent . She has decreased strength neck and right shoulder and decreased neck ROM and right shoulder ROM. Patient has NDI of 42 % and will benefit from skilled PT to improve mobility to neck and improve strength and functional abiity.    Pt will benefit from skilled therapeutic intervention in order to improve on the following deficits Decreased range of motion;Increased muscle spasms;Decreased activity tolerance;Pain;Decreased mobility;Decreased strength   Rehab Potential Good   PT Frequency 2x / week   PT Duration 6 weeks   PT Treatment/Interventions Cryotherapy;Electrical Stimulation;Moist Heat;Traction;Therapeutic exercise;Therapeutic  activities;Ultrasound;Manual techniques;Dry needling   Consulted and Agree with Plan of Care Patient         Problem List Patient Active Problem List   Diagnosis Date Noted  . Overweight (BMI 25.0-29.9) 09/25/2014  . Pure hypercholesterolemia 05/31/2012  . Other abnormal glucose 05/31/2012  . Unspecified vitamin D deficiency 05/31/2012  . Anxiety and depression 05/31/2012  . Blood pressure elevated 05/31/2012  . Abnormal mammogram 05/12/2012  . METATARSALGIA 07/02/2007   Ezekiel Ina, PT, DPT [employer] [place] [address] 10/04/2014, 5:28 PM  [place] MAIN [facility] SERVICES [address] [place], [place], [address] Phone: [phone]   Fax:  [phone]

## 2014-10-10 ENCOUNTER — Ambulatory Visit: Payer: BLUE CROSS/BLUE SHIELD | Admitting: Physical Therapy

## 2014-10-10 ENCOUNTER — Encounter: Payer: Self-pay | Admitting: Physical Therapy

## 2014-10-10 DIAGNOSIS — M542 Cervicalgia: Secondary | ICD-10-CM

## 2014-10-10 NOTE — Therapy (Signed)
[place] [facility] MAIN [facility] [address] [place], [place], [address] Phone: [phone]   Fax:  [phone]  Physical Therapy Treatment  Patient Details  Name: Karina Sampson MRN: [id number] Date of Birth: 03/23/1961 Referring Provider:  Kirt BoysMayo, Carmen Christina,*  Encounter Date: 10/10/2014      PT End of Session - 10/10/14 1608    Visit Number 2   Number of Visits 13   Date for PT Re-Evaluation 11/16/14   PT Start Time 0400   PT Stop Time 0445   PT Time Calculation (min) 45 min   Activity Tolerance Patient tolerated treatment well   Behavior During Therapy [facility] for tasks assessed/performed      Past Medical History  Diagnosis Date  . Allergy   . Depression   . Anxiety   . Hypertension   . Heart murmur   . Headache(784.0)   . Breast nodule     Right  . Unspecified disease of nail   . Symptomatic menopausal or female climacteric states   . Other B-complex deficiencies   . Ulcer disease   . Arthropathy, unspecified, site unspecified   . SVT (supraventricular tachycardia)   . Anemia, unspecified   . Pure hypercholesterolemia   . Allergic rhinitis, cause unspecified   . Obesity, unspecified   . Tobacco use disorder   . Unspecified vitamin D deficiency     Past Surgical History  Procedure Laterality Date  . Eye surgery  2001    LASIK- BOTH  . Electrophysiology study  1997  . Wisdom teeth extractions    . Svt surgery      with ablation    There were no vitals filed for this visit.  Visit Diagnosis:  Neck pain      Subjective Assessment - 10/10/14 1607    Subjective Patient has 1/10 pain and stiffness today. She thinks that she over did it some this weekend.           Patient was seen for therapeutic exercise including prone shoulder abd mid trap, prone punch fwd, prone upper trap x 15 x 2 with 1 lbs.  UBE x 5 minutes  Review of chin tucks Scapula retraction with YTB, scapula depression, scapula protraction  with YTB x 20 x 2 Wall slides x 20 x 2   corner push ups x 15 x 2 Muscle fatigue but no major pain complaints. Patient advancing to red theraband for exercises listed above.                       PT Education - 10/10/14 1608    Education provided Yes   Person(s) Educated Patient   Methods Explanation;Demonstration   Comprehension Returned demonstration;Verbal cues required             PT Long Term Goals - 10/04/14 1713    PT LONG TERM GOAL #1   Title Patient will increase cervical ROM SB to [facility] to improve posture and be able to perform functional abilities   Status New   PT LONG TERM GOAL #2   Title Patient will improve cervical strength to be able to decrease pain to lift, and carry items without pain   Status New   PT LONG TERM GOAL #3   Title Patient will be independent with home exercise program for self-management of cervical symptoms/ difficulty.   Status New   PT LONG TERM GOAL #4   Title Pateint will improve cervical ROM to  be able to drive and turn her head wihtout pain.    Status New               Problem List Patient Active Problem List   Diagnosis Date Noted  . Overweight (BMI 25.0-29.9) 09/25/2014  . Pure hypercholesterolemia 05/31/2012  . Other abnormal glucose 05/31/2012  . Unspecified vitamin D deficiency 05/31/2012  . Anxiety and depression 05/31/2012  . Blood pressure elevated 05/31/2012  . Abnormal mammogram 05/12/2012  . METATARSALGIA 07/02/2007    Ezekiel Ina 10/10/2014, 4:44 PM  [facility] MAIN [facility] SERVICES [address] [place], [place], [address] Phone: [phone]   Fax:  [phone]

## 2014-10-12 ENCOUNTER — Encounter: Payer: BLUE CROSS/BLUE SHIELD | Admitting: Physical Therapy

## 2014-10-17 ENCOUNTER — Ambulatory Visit: Payer: BLUE CROSS/BLUE SHIELD | Attending: Physician Assistant | Admitting: Physical Therapy

## 2014-10-17 ENCOUNTER — Encounter: Payer: Self-pay | Admitting: Physical Therapy

## 2014-10-17 DIAGNOSIS — M542 Cervicalgia: Secondary | ICD-10-CM | POA: Insufficient documentation

## 2014-10-19 ENCOUNTER — Encounter: Payer: BLUE CROSS/BLUE SHIELD | Admitting: Physical Therapy

## 2014-10-19 NOTE — Therapy (Signed)
[place] [facility] MAIN [facility] SERVICES [address] [place], [place], [address] Phone: [phone]   Fax:  [phone]  Physical Therapy Treatment  Patient Details  Name: Karina Sampson MRN: [id number] Date of Birth: 03-10-1961 Referring Provider:  Kirt Boys,*  Encounter Date: 10/17/2014    Past Medical History  Diagnosis Date  . Allergy   . Depression   . Anxiety   . Hypertension   . Heart murmur   . Headache(784.0)   . Breast nodule     Right  . Unspecified disease of nail   . Symptomatic menopausal or female climacteric states   . Other B-complex deficiencies   . Ulcer disease   . Arthropathy, unspecified, site unspecified   . SVT (supraventricular tachycardia)   . Anemia, unspecified   . Pure hypercholesterolemia   . Allergic rhinitis, cause unspecified   . Obesity, unspecified   . Tobacco use disorder   . Unspecified vitamin D deficiency     Past Surgical History  Procedure Laterality Date  . Eye surgery  2001    LASIK- BOTH  . Electrophysiology study  1997  . Wisdom teeth extractions    . Svt surgery      with ablation    There were no vitals filed for this visit.  Visit Diagnosis:  Neck pain                                    PT Long Term Goals - 10/04/14 1713    PT LONG TERM GOAL #1   Title Patient will increase cervical ROM SB to [facility] to improve posture and be able to perform functional abilities   Status New   PT LONG TERM GOAL #2   Title Patient will improve cervical strength to be able to decrease pain to lift, and carry items without pain   Status New   PT LONG TERM GOAL #3   Title Patient will be independent with home exercise program for self-management of cervical symptoms/ difficulty.   Status New   PT LONG TERM GOAL #4   Title Pateint will improve cervical ROM to be able to drive and turn her head wihtout pain.    Status New         Patient was seen for  therapeutic exercise including prone shoulder abd mid trap, prone punch fwd, prone upper trap x 15 x 2 with 1 lbs.  UBE x 5 minutes  Review of chin tucks Scapula retraction with YTB, scapula depression, scapula protraction with YTB x 20 x 2 Wall slides x 20 x 2  corner push ups x 15 x 2 Muscle fatigue but no major pain complaints. Patient advancing to red theraband for exercises listed above.          Problem List Patient Active Problem List   Diagnosis Date Noted  . Overweight (BMI 25.0-29.9) 09/25/2014  . Pure hypercholesterolemia 05/31/2012  . Other abnormal glucose 05/31/2012  . Unspecified vitamin D deficiency 05/31/2012  . Anxiety and depression 05/31/2012  . Blood pressure elevated 05/31/2012  . Abnormal mammogram 05/12/2012  . METATARSALGIA 07/02/2007    Jones Broom S 10/19/2014, 5:32 PM  [facility] [facility] MAIN [facility] SERVICES [address] [place], [place], [address] Phone: [phone]   Fax:  [phone]

## 2014-11-16 NOTE — Addendum Note (Signed)
Addended by: Ezekiel InaMANSFIELD, Haralambos Yeatts S on: 11/16/2014 08:58 AM   Modules accepted: Orders

## 2014-11-16 NOTE — Therapy (Signed)
[place] [facility] MAIN [facility] [address] [place], [place], [address] Phone: [phone]   Fax:  [phone]  Physical Therapy Evaluation  Patient Details  Name: Karina ReddenVicky L Riedl MRN: [id number] Date of Birth: 01/11/1961 Referring Provider:  Venita LickBrooks, Dahari, MD  Encounter Date: 10/04/2014    Past Medical History  Diagnosis Date  . Allergy   . Depression   . Anxiety   . Hypertension   . Heart murmur   . Headache(784.0)   . Breast nodule     Right  . Unspecified disease of nail   . Symptomatic menopausal or female climacteric states   . Other B-complex deficiencies   . Ulcer disease   . Arthropathy, unspecified, site unspecified   . SVT (supraventricular tachycardia)   . Anemia, unspecified   . Pure hypercholesterolemia   . Allergic rhinitis, cause unspecified   . Obesity, unspecified   . Tobacco use disorder   . Unspecified vitamin D deficiency     Past Surgical History  Procedure Laterality Date  . Eye surgery  2001    LASIK- BOTH  . Electrophysiology study  1997  . Wisdom teeth extractions    . Svt surgery      with ablation    There were no vitals filed for this visit.  Visit Diagnosis:  Neck pain - Plan: PT plan of care cert/re-cert                                  PT Long Term Goals - 10/04/14 1713    PT LONG TERM GOAL #1   Title Patient will increase cervical ROM SB to [facility] to improve posture and be able to perform functional abilities   Status New   PT LONG TERM GOAL #2   Title Patient will improve cervical strength to be able to decrease pain to lift, and carry items without pain   Status New   PT LONG TERM GOAL #3   Title Patient will be independent with home exercise program for self-management of cervical symptoms/ difficulty.   Status New   PT LONG TERM GOAL #4   Title Pateint will improve cervical ROM to be able to drive and turn her head wihtout pain.    Status New                 Problem List Patient Active Problem List   Diagnosis Date Noted  . Overweight (BMI 25.0-29.9) 09/25/2014  . Pure hypercholesterolemia 05/31/2012  . Other abnormal glucose 05/31/2012  . Unspecified vitamin D deficiency 05/31/2012  . Anxiety and depression 05/31/2012  . Blood pressure elevated 05/31/2012  . Abnormal mammogram 05/12/2012  . METATARSALGIA 07/02/2007    Ezekiel InaMansfield, Lorel Lembo S 11/16/2014, 8:52 AM  [place] [facility] MAIN [facility] [address] [place], [place], [address] Phone: [phone]   Fax:  [phone]

## 2015-02-03 ENCOUNTER — Other Ambulatory Visit: Payer: Self-pay | Admitting: Family Medicine

## 2015-02-17 ENCOUNTER — Encounter: Payer: Self-pay | Admitting: Family Medicine

## 2015-03-28 ENCOUNTER — Encounter: Payer: BLUE CROSS/BLUE SHIELD | Admitting: Family Medicine

## 2015-03-30 ENCOUNTER — Encounter: Payer: Self-pay | Admitting: Family Medicine

## 2015-03-30 ENCOUNTER — Ambulatory Visit (INDEPENDENT_AMBULATORY_CARE_PROVIDER_SITE_OTHER): Payer: 59 | Admitting: Family Medicine

## 2015-03-30 VITALS — BP 121/75 | HR 87 | Temp 98.7°F | Resp 16 | Ht 64.5 in | Wt 181.2 lb

## 2015-03-30 DIAGNOSIS — E669 Obesity, unspecified: Secondary | ICD-10-CM

## 2015-03-30 DIAGNOSIS — J301 Allergic rhinitis due to pollen: Secondary | ICD-10-CM | POA: Diagnosis not present

## 2015-03-30 DIAGNOSIS — F32A Depression, unspecified: Secondary | ICD-10-CM

## 2015-03-30 DIAGNOSIS — F329 Major depressive disorder, single episode, unspecified: Secondary | ICD-10-CM

## 2015-03-30 DIAGNOSIS — F418 Other specified anxiety disorders: Secondary | ICD-10-CM | POA: Diagnosis not present

## 2015-03-30 DIAGNOSIS — I471 Supraventricular tachycardia: Secondary | ICD-10-CM

## 2015-03-30 DIAGNOSIS — Z1159 Encounter for screening for other viral diseases: Secondary | ICD-10-CM | POA: Diagnosis not present

## 2015-03-30 DIAGNOSIS — E559 Vitamin D deficiency, unspecified: Secondary | ICD-10-CM

## 2015-03-30 DIAGNOSIS — Z114 Encounter for screening for human immunodeficiency virus [HIV]: Secondary | ICD-10-CM | POA: Diagnosis not present

## 2015-03-30 DIAGNOSIS — Z6830 Body mass index (BMI) 30.0-30.9, adult: Secondary | ICD-10-CM

## 2015-03-30 DIAGNOSIS — F419 Anxiety disorder, unspecified: Secondary | ICD-10-CM

## 2015-03-30 DIAGNOSIS — E7800 Pure hypercholesterolemia, unspecified: Secondary | ICD-10-CM | POA: Diagnosis not present

## 2015-03-30 DIAGNOSIS — R7302 Impaired glucose tolerance (oral): Secondary | ICD-10-CM

## 2015-03-30 DIAGNOSIS — Z0000 Encounter for general adult medical examination without abnormal findings: Secondary | ICD-10-CM | POA: Diagnosis not present

## 2015-03-30 LAB — COMPREHENSIVE METABOLIC PANEL
ALBUMIN: 4.3 g/dL (ref 3.6–5.1)
ALT: 20 U/L (ref 6–29)
AST: 20 U/L (ref 10–35)
Alkaline Phosphatase: 74 U/L (ref 33–130)
BUN: 12 mg/dL (ref 7–25)
CHLORIDE: 103 mmol/L (ref 98–110)
CO2: 23 mmol/L (ref 20–31)
CREATININE: 0.69 mg/dL (ref 0.50–1.05)
Calcium: 9.2 mg/dL (ref 8.6–10.4)
Glucose, Bld: 98 mg/dL (ref 65–99)
POTASSIUM: 4 mmol/L (ref 3.5–5.3)
Sodium: 139 mmol/L (ref 135–146)
Total Bilirubin: 0.4 mg/dL (ref 0.2–1.2)
Total Protein: 6.8 g/dL (ref 6.1–8.1)

## 2015-03-30 LAB — LIPID PANEL
CHOL/HDL RATIO: 2.2 ratio (ref <=5.0)
Cholesterol: 194 mg/dL (ref 125–200)
HDL: 88 mg/dL (ref >=46)
LDL Cholesterol: 94 mg/dL (ref <130)
TRIGLYCERIDES: 59 mg/dL (ref <150)
VLDL: 12 mg/dL (ref <30)

## 2015-03-30 LAB — CBC WITH DIFFERENTIAL/PLATELET
Basophils Absolute: 0.1 10*3/uL (ref 0.0–0.1)
Basophils Relative: 1 % (ref 0–1)
EOS ABS: 0.2 10*3/uL (ref 0.0–0.7)
Eosinophils Relative: 4 % (ref 0–5)
HCT: 38.3 % (ref 36.0–46.0)
HEMOGLOBIN: 12.4 g/dL (ref 12.0–15.0)
Lymphocytes Relative: 35 % (ref 12–46)
Lymphs Abs: 1.9 10*3/uL (ref 0.7–4.0)
MCH: 28 pg (ref 26.0–34.0)
MCHC: 32.4 g/dL (ref 30.0–36.0)
MCV: 86.5 fL (ref 78.0–100.0)
MONO ABS: 0.6 10*3/uL (ref 0.1–1.0)
MONOS PCT: 10 % (ref 3–12)
MPV: 10.1 fL (ref 8.6–12.4)
NEUTROS ABS: 2.8 10*3/uL (ref 1.7–7.7)
Neutrophils Relative %: 50 % (ref 43–77)
Platelets: 237 10*3/uL (ref 150–400)
RBC: 4.43 MIL/uL (ref 3.87–5.11)
RDW: 13.1 % (ref 11.5–15.5)
WBC: 5.5 10*3/uL (ref 4.0–10.5)

## 2015-03-30 LAB — VITAMIN B12: Vitamin B-12: 492 pg/mL (ref 211–911)

## 2015-03-30 LAB — POCT URINALYSIS DIP (MANUAL ENTRY)
Glucose, UA: NEGATIVE
Nitrite, UA: NEGATIVE
PH UA: 6
PROTEIN UA: NEGATIVE
SPEC GRAV UA: 1.025
Urobilinogen, UA: 1

## 2015-03-30 LAB — HEPATITIS C ANTIBODY: HCV Ab: NEGATIVE

## 2015-03-30 LAB — HEMOGLOBIN A1C
Hgb A1c MFr Bld: 5.7 % — ABNORMAL HIGH (ref <5.7)
MEAN PLASMA GLUCOSE: 117 mg/dL — AB (ref <117)

## 2015-03-30 LAB — TSH: TSH: 1.952 u[IU]/mL (ref 0.350–4.500)

## 2015-03-30 LAB — HIV ANTIBODY (ROUTINE TESTING W REFLEX): HIV 1&2 Ab, 4th Generation: NONREACTIVE

## 2015-03-30 LAB — VITAMIN D 25 HYDROXY (VIT D DEFICIENCY, FRACTURES): Vit D, 25-Hydroxy: 49 ng/mL (ref 30–100)

## 2015-03-30 MED ORDER — DULOXETINE HCL 60 MG PO CPEP: ORAL_CAPSULE | ORAL | Status: DC

## 2015-03-30 MED ORDER — BUPROPION HCL ER (XL) 150 MG PO TB24: 150.0000 mg | ORAL_TABLET | Freq: Every day | ORAL | Status: DC

## 2015-03-30 MED ORDER — FLUTICASONE PROPIONATE 50 MCG/ACT NA SUSP: 2.0000 | Freq: Every day | NASAL | Status: DC

## 2015-03-30 MED ORDER — SIMVASTATIN 20 MG PO TABS: 20.0000 mg | ORAL_TABLET | Freq: Every day | ORAL | Status: DC

## 2015-03-30 MED ORDER — ZOSTER VACCINE LIVE 19400 UNT/0.65ML ~~LOC~~ SOLR: 0.6500 mL | Freq: Once | SUBCUTANEOUS | Status: DC

## 2015-03-30 NOTE — Patient Instructions (Signed)

## 2015-03-30 NOTE — Progress Notes (Signed)
Subjective:    Patient ID: Karina Sampson, female    DOB: 05-13-1960, 54 y.o.   MRN: [id number]  03/30/2015  Annual Exam   HPI This 54 y.o. female presents for Complete Physical Examination.  Last physical:  03-22-14 Pap smear:  03-22-14  WNL HPV negative Mammogram:  04-14-13; [place] Colonoscopy:  04-21-13 WNL; repeat 10 years. TDAP:  2012 Influenza: 02/10/2015 at work Eye exam:  Every two years; corneal abrasion 02/2015.  Comb abrasion. Dental exam:  Every six months.  Arthralgias:  01/2015.  No change in medication; continue Celebrex; send labs to [facility].    Preparing to sell house; moving to [place].  Engaged in September.  Move not set; big adjustment for family; seeing psychiatrist. Step daughter [place] never had anyone else added to family.  Separate families for 13 years; separated when 2 months old.    Anxiety and depression: taking one Cymbalta daily.  Winter months coming and suffers with seasonal depression.  Cannot tolerate higher dose of Cymbalta due to delayed orgasm; tolerated Wellbutrin in past yet current stressors and has anxiety.  +stress incontinence; nocturia 0-1.    +insomnia; wakes up during the night.  Sometimes cannot go back to sleep.  Legs hurt when going to sleep; has throbbing and crawling sensation in legs.  RLS.   Cannot turn brain off.    Review of Systems  Constitutional: Negative for fever, chills, diaphoresis, activity change, appetite change, fatigue and unexpected weight change.  HENT: Negative for congestion, dental problem, drooling, ear discharge, ear pain, facial swelling, hearing loss, mouth sores, nosebleeds, postnasal drip, rhinorrhea, sinus pressure, sneezing, sore throat, tinnitus, trouble swallowing and voice change.   Eyes: Negative for photophobia, pain, discharge, redness, itching and visual disturbance.  Respiratory: Negative for apnea, cough, choking, chest tightness, shortness of breath, wheezing and stridor.     Cardiovascular: Negative for chest pain, palpitations and leg swelling.  Gastrointestinal: Negative for nausea, vomiting, abdominal pain, diarrhea, constipation, blood in stool, abdominal distention, anal bleeding and rectal pain.  Endocrine: Negative for cold intolerance, heat intolerance, polydipsia, polyphagia and polyuria.  Genitourinary: Negative for dysuria, urgency, frequency, hematuria, flank pain, decreased urine volume, vaginal bleeding, vaginal discharge, enuresis, difficulty urinating, genital sores, vaginal pain, menstrual problem, pelvic pain and dyspareunia.  Musculoskeletal: Positive for arthralgias. Negative for myalgias, back pain, joint swelling, gait problem, neck pain and neck stiffness.  Skin: Negative for color change, pallor, rash and wound.  Allergic/Immunologic: Negative for environmental allergies, food allergies and immunocompromised state.  Neurological: Negative for dizziness, tremors, seizures, syncope, facial asymmetry, speech difficulty, weakness, light-headedness, numbness and headaches.  Hematological: Negative for adenopathy. Does not bruise/bleed easily.  Psychiatric/Behavioral: Positive for sleep disturbance. Negative for suicidal ideas, hallucinations, behavioral problems, confusion, self-injury, dysphoric mood, decreased concentration and agitation. The patient is nervous/anxious. The patient is not hyperactive.     Past Medical History  Diagnosis Date  . Allergy   . Depression   . Anxiety   . Hypertension   . Heart murmur   . Headache(784.0)   . Breast nodule     Right  . Unspecified disease of nail   . Symptomatic menopausal or female climacteric states   . Other B-complex deficiencies   . Ulcer disease   . Arthropathy, unspecified, site unspecified   . SVT (supraventricular tachycardia) (HCC)   . Anemia, unspecified   . Pure hypercholesterolemia   . Allergic rhinitis, cause unspecified   . Obesity, unspecified   . Tobacco use disorder   .  Unspecified vitamin D deficiency   . Arthritis    Past Surgical History  Procedure Laterality Date  . Eye surgery  2001    LASIK- BOTH  . Electrophysiology study  1997  . Wisdom teeth extractions    . Svt surgery      with ablation   Allergies  Allergen Reactions  . Penicillins Diarrhea and Nausea And Vomiting   Current Outpatient Prescriptions  Medication Sig Dispense Refill  . B Complex Vitamins (VITAMIN B COMPLEX PO) Take by mouth daily.    . celecoxib (CELEBREX) 200 MG capsule Take 200 mg by mouth daily.    . Cholecalciferol (VITAMIN D3 PO) Take 4,000 Units by mouth daily.    . DULoxetine (CYMBALTA) 60 MG capsule TAKE 1 CAPSULE BY MOUTH DAILY. 90 capsule 1  . fluticasone (FLONASE) 50 MCG/ACT nasal spray Place 2 sprays into both nostrils daily. 16 g 11  . Omega-3 Fatty Acids (FISH OIL CONCENTRATE) 1000 MG CAPS Take 1,000 mg by mouth daily.    . simvastatin (ZOCOR) 20 MG tablet Take 1 tablet (20 mg total) by mouth at bedtime. 90 tablet 3  . aspirin 81 MG tablet Take 81 mg by mouth daily.    Marland Kitchen. buPROPion (WELLBUTRIN XL) 150 MG 24 hr tablet Take 1 tablet (150 mg total) by mouth daily. 90 tablet 3  . zoster vaccine live, PF, (ZOSTAVAX) [id number] UNT/0.65ML injection Inject 19,400 Units into the skin once. 1 each 0   No current facility-administered medications for this visit.   Social History   Social History  . Marital Status: Single    Spouse Name: N/A  . Number of Children: 0  . Years of Education: postgradua   Occupational History  . [employer]     x7453yrs   Social History Main Topics  . Smoking status: Former Smoker -- 1.00 packs/day    Types: Cigarettes    Quit date: 05/13/1995  . Smokeless tobacco: Not on file     Comment: quit 1997  . Alcohol Use: 1.2 oz/week    2 Cans of beer per week     Comment: occasional once a month  . Drug Use: No  . Sexual Activity: Yes    Birth Control/ Protection: None     Comment: number of sex parnters in the last 12  months 1; same sex partners   Other Topics Concern  . Not on file   Social History Narrative    Marital status: Single; Dating same sex partner; happy, no abuse.  Engaged in 01/2015.      Children: none; partner has 56 year old daughter.  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      Lives:  Alone; [place] girlfriend.        Employment: [employer] x 13 years.  Moderately happy.        Tobacco: none      Alcohol: rare; holidays only.        Drugs: none      Exercise: playing pickleball.  Hiking.  2-3 days per week.        Sexual activity: sexually active with same sexual partner.      Seatbelts: 100%   Family History  Problem Relation Age of Onset  . Dementia Mother   . Depression Mother   . Alzheimer's disease Mother   . Hypertension Mother   . Aortic aneurysm Father   . Hyperlipidemia Father   . Cancer Father  skin  . Stroke Father 53    retinal occlusion with visual loss central  . Parkinson's disease Brother   . Parkinsonism Brother   . Bone cancer    . Arthritis Brother     L hip replacement       Objective:    BP 121/75 mmHg  Pulse 87  Temp(Src) 98.7 F (37.1 C) (Oral)  Resp 16  Ht 5' 4.5" (1.638 m)  Wt 181 lb 3.2 oz (82.192 kg)  BMI 30.63 kg/m2 Physical Exam  Constitutional: She is oriented to person, place, and time. She appears well-developed and well-nourished. No distress.  HENT:  Head: Normocephalic and atraumatic.  Right Ear: External ear normal.  Left Ear: External ear normal.  Nose: Nose normal.  Mouth/Throat: Oropharynx is clear and moist.  Eyes: Conjunctivae and EOM are normal. Pupils are equal, round, and reactive to light.  Neck: Normal range of motion and full passive range of motion without pain. Neck supple. No JVD present. Carotid bruit is not present. No thyromegaly present.  Cardiovascular: Normal rate, regular rhythm and normal heart sounds.  Exam reveals no gallop and no friction rub.   No murmur  heard. Pulmonary/Chest: Effort normal and breath sounds normal. She has no wheezes. She has no rales. Right breast exhibits no inverted nipple, no mass, no nipple discharge, no skin change and no tenderness. Left breast exhibits no inverted nipple, no mass, no nipple discharge, no skin change and no tenderness. Breasts are symmetrical.  Abdominal: Soft. Bowel sounds are normal. She exhibits no distension and no mass. There is no tenderness. There is no rebound and no guarding.  Musculoskeletal:       Right shoulder: Normal.       Left shoulder: Normal.       Cervical back: Normal.  Lymphadenopathy:    She has no cervical adenopathy.  Neurological: She is alert and oriented to person, place, and time. She has normal reflexes. No cranial nerve deficit. She exhibits normal muscle tone. Coordination normal.  Skin: Skin is warm and dry. No rash noted. She is not diaphoretic. No erythema. No pallor.  Psychiatric: She has a normal mood and affect. Her behavior is normal. Judgment and thought content normal.  Nursing note and vitals reviewed.       Assessment & Plan:   1. Routine physical examination   2. Pure hypercholesterolemia   3. Anxiety and depression   4. Glucose intolerance (impaired glucose tolerance)   5. Vitamin D deficiency   6. Screening for HIV (human immunodeficiency virus)   7. Need for hepatitis C screening test   8. SVT (supraventricular tachycardia) (HCC)   9. Obesity   10. BMI 30.0-30.9,adult   11. Non-seasonal allergic rhinitis due to pollen     1. Complete Physical Examination: anticipatory guidance --- exercise, weight loss, 3 servings of dairy daily.  Pap smear and mammogram and colonoscopy UTD.  Immunizations UTD.   2.  Hypercholesterolemia: stable; obtain labs; refills provided. 3.  Anxiety and depression: worsening anxiety as transitioning to new relationship and new home; continue Cymbalta 60mg  daily; add Wellbutrin XL 150mg  daily. Continue counseling with  fiance and family. 4. Glucose Intolerance: stable; obtain labs. 5.  Vitamin D deficiency: obtain labs. 6.  Screening HIV: obtain labs. 7.  Screening Hepatitis C: obtain labs. 8.  SVT: stable; s/p ablation; no recent issus; EKG in 2015. 9.  Obesity/BMI 30: recommend weight loss, exercise, low-caloric food choices. 10. Allergic Rhinitis:  Controlled; continue current medications.  Orders Placed This Encounter  Procedures  . CBC with Differential/Platelet  . Comprehensive metabolic panel    Order Specific Question:  Has the patient fasted?    Answer:  Yes  . Hemoglobin A1c  . Lipid panel    Order Specific Question:  Has the patient fasted?    Answer:  Yes  . HIV antibody  . TSH  . VITAMIN D 25 Hydroxy (Vit-D Deficiency, Fractures)  . Vitamin B12  . Hepatitis C antibody  . POCT urinalysis dipstick   Meds ordered this encounter  Medications  . zoster vaccine live, PF, (ZOSTAVAX) 40981 UNT/0.65ML injection    Sig: Inject 19,400 Units into the skin once.    Dispense:  1 each    Refill:  0  . buPROPion (WELLBUTRIN XL) 150 MG 24 hr tablet    Sig: Take 1 tablet (150 mg total) by mouth daily.    Dispense:  90 tablet    Refill:  3  . simvastatin (ZOCOR) 20 MG tablet    Sig: Take 1 tablet (20 mg total) by mouth at bedtime.    Dispense:  90 tablet    Refill:  3  . fluticasone (FLONASE) 50 MCG/ACT nasal spray    Sig: Place 2 sprays into both nostrils daily.    Dispense:  16 g    Refill:  11  . DULoxetine (CYMBALTA) 60 MG capsule    Sig: TAKE 1 CAPSULE BY MOUTH DAILY.    Dispense:  90 capsule    Refill:  1    Return in about 6 months (around 09/27/2015) for recheck cholesterol, anxiety/depression.   [place] Paulita Fujita, M.D. [facility] [address] [place], [place]  [address] [phone] phone [phone] fax

## 2015-09-28 ENCOUNTER — Ambulatory Visit: Payer: 59 | Admitting: Family Medicine

## 2015-10-02 ENCOUNTER — Encounter: Payer: Self-pay | Admitting: Family Medicine

## 2015-10-02 ENCOUNTER — Ambulatory Visit (INDEPENDENT_AMBULATORY_CARE_PROVIDER_SITE_OTHER): Payer: 59 | Admitting: Family Medicine

## 2015-10-02 VITALS — BP 132/79 | HR 93 | Temp 98.7°F | Resp 16 | Ht 65.0 in | Wt 170.0 lb

## 2015-10-02 DIAGNOSIS — R7309 Other abnormal glucose: Secondary | ICD-10-CM

## 2015-10-02 DIAGNOSIS — K5901 Slow transit constipation: Secondary | ICD-10-CM | POA: Diagnosis not present

## 2015-10-02 DIAGNOSIS — Z1231 Encounter for screening mammogram for malignant neoplasm of breast: Secondary | ICD-10-CM

## 2015-10-02 DIAGNOSIS — E7800 Pure hypercholesterolemia, unspecified: Secondary | ICD-10-CM

## 2015-10-02 DIAGNOSIS — F32A Depression, unspecified: Secondary | ICD-10-CM

## 2015-10-02 DIAGNOSIS — F329 Major depressive disorder, single episode, unspecified: Secondary | ICD-10-CM

## 2015-10-02 DIAGNOSIS — R3129 Other microscopic hematuria: Secondary | ICD-10-CM

## 2015-10-02 DIAGNOSIS — F418 Other specified anxiety disorders: Secondary | ICD-10-CM

## 2015-10-02 DIAGNOSIS — R159 Full incontinence of feces: Secondary | ICD-10-CM

## 2015-10-02 DIAGNOSIS — F419 Anxiety disorder, unspecified: Principal | ICD-10-CM

## 2015-10-02 LAB — CBC WITH DIFFERENTIAL/PLATELET
BASOS PCT: 1 %
Basophils Absolute: 63 cells/uL (ref 0–200)
Eosinophils Absolute: 126 cells/uL (ref 15–500)
Eosinophils Relative: 2 %
HEMATOCRIT: 39.2 % (ref 35.0–45.0)
Hemoglobin: 12.9 g/dL (ref 11.7–15.5)
LYMPHS PCT: 30 %
Lymphs Abs: 1890 cells/uL (ref 850–3900)
MCH: 28.1 pg (ref 27.0–33.0)
MCHC: 32.9 g/dL (ref 32.0–36.0)
MCV: 85.4 fL (ref 80.0–100.0)
MONO ABS: 630 {cells}/uL (ref 200–950)
MPV: 9.9 fL (ref 7.5–12.5)
Monocytes Relative: 10 %
Neutro Abs: 3591 cells/uL (ref 1500–7800)
Neutrophils Relative %: 57 %
Platelets: 229 10*3/uL (ref 140–400)
RBC: 4.59 MIL/uL (ref 3.80–5.10)
RDW: 13.9 % (ref 11.0–15.0)
WBC: 6.3 10*3/uL (ref 3.8–10.8)

## 2015-10-02 LAB — POC MICROSCOPIC URINALYSIS (UMFC): MUCUS RE: ABSENT

## 2015-10-02 LAB — POCT URINALYSIS DIP (MANUAL ENTRY)
Bilirubin, UA: NEGATIVE
Glucose, UA: NEGATIVE
Ketones, POC UA: NEGATIVE
NITRITE UA: NEGATIVE
PH UA: 7
PROTEIN UA: NEGATIVE
Spec Grav, UA: 1.01
UROBILINOGEN UA: 0.2

## 2015-10-02 LAB — COMPREHENSIVE METABOLIC PANEL
ALK PHOS: 65 U/L (ref 33–130)
ALT: 17 U/L (ref 6–29)
AST: 19 U/L (ref 10–35)
Albumin: 4.3 g/dL (ref 3.6–5.1)
BILIRUBIN TOTAL: 0.3 mg/dL (ref 0.2–1.2)
BUN: 12 mg/dL (ref 7–25)
CALCIUM: 8.8 mg/dL (ref 8.6–10.4)
CO2: 24 mmol/L (ref 20–31)
CREATININE: 0.55 mg/dL (ref 0.50–1.05)
Chloride: 103 mmol/L (ref 98–110)
GLUCOSE: 92 mg/dL (ref 65–99)
Potassium: 4.1 mmol/L (ref 3.5–5.3)
Sodium: 137 mmol/L (ref 135–146)
Total Protein: 6.7 g/dL (ref 6.1–8.1)

## 2015-10-02 LAB — LIPID PANEL
CHOLESTEROL: 157 mg/dL (ref 125–200)
HDL: 71 mg/dL (ref >=46)
LDL CALC: 72 mg/dL (ref <130)
Total CHOL/HDL Ratio: 2.2 Ratio (ref <=5.0)
Triglycerides: 69 mg/dL (ref <150)
VLDL: 14 mg/dL (ref <30)

## 2015-10-02 LAB — HEMOGLOBIN A1C
Hgb A1c MFr Bld: 5.5 % (ref <5.7)
Mean Plasma Glucose: 111 mg/dL

## 2015-10-02 NOTE — Patient Instructions (Addendum)
1. Increase fruits and vegetables daily.  Increase water intake daily.  One month trial. 2.  If no success with increased fruits/vegetables/water, add Fibercon daily.  One month. 3.  If no success with Fibercon, add Miralax or Colace daily.  One month.  4.  If no improvement with this regimen, call for referral to gastroenterology.  We recommend that you schedule a mammogram for breast cancer screening. Typically, you do not need a referral to do this. Please contact a local imaging center to schedule your mammogram.  [facility] - [phone]  *ask for the Radiology Department [facility]) - [phone] or [phone]  [facility] - [phone] [facility] - [phone] [facility] - [phone]  *ask for the Radiology Department [facility] - [phone]  *ask for the Radiology Department [facility] - [phone]  *ask for the Mammography Department [facility] - [phone]     IF you received an x-ray today, you will receive an invoice from [facility]. Please contact [facility] at [phone] with questions or concerns regarding your invoice.   IF you received labwork today, you will receive an invoice from [facility]. Please contact [facility] at [phone] with questions or concerns regarding your invoice.   Our billing staff will not be able to assist you with questions regarding bills from these companies.  You will be contacted with the lab results as soon as they are available. The fastest way to get your results is to activate your My Chart account. Instructions are located on the last page of this paperwork. If you have not heard from us regarding the results in 2 weeks, please contact this office.

## 2015-10-02 NOTE — Progress Notes (Signed)
Subjective:    Patient ID: Karina Sampson, female    DOMurrell ReddenB: 02/15/1961, 55 y.o.   MRN: [id number]  10/02/2015  Anxiety and Hyperlipidemia   HPI This 55 y.o. female presents for six month follow-up:  1. Hyperlipidemia: Patient reports good compliance with medication, good tolerance to medication, and good symptom control.    2.  Glucose intolerance: went sugar free Mrach 1, 2017. Does not feel different.   3. Anxiety and depression: Patient reports good compliance with medication, good tolerance to medication, and good symptom control. Got married in March 2017.  Will put house on the market October 11, 2015.  Commuting.   No longer in counseling.  Still has not lived together totally.  Daughter is 57 years old.   4.  Microscopic hematuria: present at CPE.  5. Arhthralgias: still seeing Erin every six months.  Taking Celebrex daily.  Followed every six months.  Exercising three times per week.   6.  Fecal incontinence:  Feels done.  Onset in the past few months.  No urinary incontinence.    Review of Systems  Constitutional: Negative for fever, chills, diaphoresis and fatigue.  Eyes: Negative for visual disturbance.  Respiratory: Negative for cough and shortness of breath.   Cardiovascular: Negative for chest pain, palpitations and leg swelling.  Gastrointestinal: Negative for nausea, vomiting, abdominal pain, diarrhea, constipation, blood in stool, abdominal distention and anal bleeding.  Endocrine: Negative for cold intolerance, heat intolerance, polydipsia, polyphagia and polyuria.  Neurological: Negative for dizziness, tremors, seizures, syncope, facial asymmetry, speech difficulty, weakness, light-headedness, numbness and headaches.    Past Medical History  Diagnosis Date  . Allergy   . Depression   . Anxiety   . Hypertension   . Heart murmur   . Headache(784.0)   . Breast nodule     Right  . Unspecified disease of nail   . Symptomatic menopausal or female climacteric  states   . Other B-complex deficiencies   . Ulcer disease   . Arthropathy, unspecified, site unspecified   . SVT (supraventricular tachycardia) (HCC)   . Anemia, unspecified   . Pure hypercholesterolemia   . Allergic rhinitis, cause unspecified   . Obesity, unspecified   . Tobacco use disorder   . Unspecified vitamin D deficiency   . Arthritis    Past Surgical History  Procedure Laterality Date  . Eye surgery  2001    LASIK- BOTH  . Electrophysiology study  1997  . Wisdom teeth extractions    . Svt surgery      with ablation   Allergies  Allergen Reactions  . Penicillins Diarrhea and Nausea And Vomiting    Social History   Social History  . Marital Status: Single    Spouse Name: N/A  . Number of Children: 0  . Years of Education: postgradua   Occupational History  . [employer]     x3269yrs   Social History Main Topics  . Smoking status: Former Smoker -- 1.00 packs/day    Types: Cigarettes    Quit date: 05/13/1995  . Smokeless tobacco: Not on file     Comment: quit 1997  . Alcohol Use: 1.2 oz/week    2 Cans of beer per week     Comment: occasional once a month  . Drug Use: No  . Sexual Activity: Yes    Birth Control/ Protection: None     Comment: number of sex parnters in the last 12 months 1; same sex partners  Other Topics Concern  . Not on file   Social History Narrative    Marital status: Single; Dating same sex partner; happy, no abuse.  Engaged in 01/2015.      Children: none; partner has 40 year old daughter.  Karina Sampson; ballet and singer.      Lives:  Alone; moving to [place] to move in with girlfriend.        Employment: [employer] x 13 years.  Moderately happy.        Tobacco: none      Alcohol: rare; holidays only.        Drugs: none      Exercise: playing pickleball.  Hiking.  2-3 days per week.        Sexual activity: sexually active with same sexual partner.      Seatbelts: 100%   Family History  Problem  Relation Age of Onset  . Dementia Mother   . Depression Mother   . Alzheimer's disease Mother   . Hypertension Mother   . Aortic aneurysm Father   . Hyperlipidemia Father   . Cancer Father     skin  . Stroke Father 5    retinal occlusion with visual loss central  . Parkinson's disease Brother   . Parkinsonism Brother   . Bone cancer    . Arthritis Brother     L hip replacement       Objective:    BP 132/79 mmHg  Pulse 93  Temp(Src) 98.7 F (37.1 C) (Oral)  Resp 16  Ht  (1.651 m)  Wt 170 lb (77.111 kg)  BMI 28.29 kg/m2 Physical Exam  Constitutional: She is oriented to person, place, and time. She appears well-developed and well-nourished. No distress.  HENT:  Head: Normocephalic and atraumatic.  Right Ear: External ear normal.  Left Ear: External ear normal.  Nose: Nose normal.  Mouth/Throat: Oropharynx is clear and moist.  Eyes: Conjunctivae and EOM are normal. Pupils are equal, round, and reactive to light.  Neck: Normal range of motion. Neck supple. Carotid bruit is not present. No thyromegaly present.  Cardiovascular: Normal rate, regular rhythm, normal heart sounds and intact distal pulses.  Exam reveals no gallop and no friction rub.   No murmur heard. Pulmonary/Chest: Effort normal and breath sounds normal. She has no wheezes. She has no rales.  Abdominal: Soft. Bowel sounds are normal. She exhibits no distension and no mass. There is no tenderness. There is no rebound and no guarding.  Genitourinary: Rectum normal.  Lymphadenopathy:    She has no cervical adenopathy.  Neurological: She is alert and oriented to person, place, and time. No cranial nerve deficit.  Skin: Skin is warm and dry. No rash noted. She is not diaphoretic. No erythema. No pallor.  Psychiatric: She has a normal mood and affect. Her behavior is normal.        Assessment & Plan:   1. Anxiety and depression   2. Other abnormal glucose   3. Pure hypercholesterolemia   4.  Hematuria, microscopic   5. Fecal incontinence   6. Slow transit constipation   7. Encounter for screening mammogram for breast cancer    1. Increase fruits and vegetables daily.  Increase water intake daily.  One month trial. 2.  If no success with increased fruits/vegetables/water, add Fibercon daily.  One month. 3.  If no success with Fibercon, add Miralax or Colace daily.  One month.  4.  If no improvement with this  regimen, call for referral to gastroenterology.  We recommend that you schedule a mammogram for breast cancer screening. Typically, you do not need a referral to do this. Please contact a local imaging center to schedule your mammogram.   Orders Placed This Encounter  Procedures  . MM SCREENING BREAST TOMO BILATERAL    Standing Status: Future     Number of Occurrences:      Standing Expiration Date: 12/01/2016    Order Specific Question:  Reason for Exam (SYMPTOM  OR DIAGNOSIS REQUIRED)    Answer:  screening breast cancer    Order Specific Question:  Is the patient pregnant?    Answer:  No    Order Specific Question:  Preferred imaging location?    Answer:  [facility]  . CBC with Differential/Platelet  . Comprehensive metabolic panel    Order Specific Question:  Has the patient fasted?    Answer:  Yes  . Hemoglobin A1c  . Lipid panel    Order Specific Question:  Has the patient fasted?    Answer:  Yes  . POCT urinalysis dipstick  . POCT Microscopic Urinalysis ([facility])   No orders of the defined types were placed in this encounter.    Return in about 6 months (around 04/03/2016) for complete physical examiniation.    Nolawi Kanady Paulita Fujita, M.D. [facility] [address] [place], [place]  [address] [phone] phone [phone] fax

## 2015-10-07 ENCOUNTER — Other Ambulatory Visit: Payer: Self-pay | Admitting: Family Medicine

## 2016-02-05 ENCOUNTER — Ambulatory Visit
Admission: RE | Admit: 2016-02-05 | Discharge: 2016-02-05 | Disposition: A | Discharge status: Home / Self Care | Payer: Managed Care, Other (non HMO) | Source: Ambulatory Visit | Attending: Family Medicine | Admitting: Family Medicine

## 2016-02-05 DIAGNOSIS — Z1231 Encounter for screening mammogram for malignant neoplasm of breast: Secondary | ICD-10-CM

## 2016-03-29 ENCOUNTER — Other Ambulatory Visit: Payer: Self-pay | Admitting: Family Medicine

## 2016-04-02 ENCOUNTER — Other Ambulatory Visit: Payer: Self-pay | Admitting: Family Medicine

## 2016-04-02 DIAGNOSIS — J301 Allergic rhinitis due to pollen: Secondary | ICD-10-CM

## 2016-04-02 NOTE — Telephone Encounter (Signed)
Needs office visit.

## 2016-04-15 ENCOUNTER — Ambulatory Visit (INDEPENDENT_AMBULATORY_CARE_PROVIDER_SITE_OTHER): Payer: 59 | Admitting: Family Medicine

## 2016-04-15 ENCOUNTER — Encounter: Payer: Self-pay | Admitting: Family Medicine

## 2016-04-15 VITALS — BP 134/78 | HR 90 | Temp 98.7°F | Resp 16 | Ht 65.0 in | Wt 153.0 lb

## 2016-04-15 DIAGNOSIS — J3089 Other allergic rhinitis: Secondary | ICD-10-CM | POA: Diagnosis not present

## 2016-04-15 DIAGNOSIS — I471 Supraventricular tachycardia: Secondary | ICD-10-CM

## 2016-04-15 DIAGNOSIS — E559 Vitamin D deficiency, unspecified: Secondary | ICD-10-CM | POA: Diagnosis not present

## 2016-04-15 DIAGNOSIS — E7800 Pure hypercholesterolemia, unspecified: Secondary | ICD-10-CM | POA: Diagnosis not present

## 2016-04-15 DIAGNOSIS — E663 Overweight: Secondary | ICD-10-CM

## 2016-04-15 DIAGNOSIS — F32A Depression, unspecified: Secondary | ICD-10-CM

## 2016-04-15 DIAGNOSIS — R7309 Other abnormal glucose: Secondary | ICD-10-CM | POA: Diagnosis not present

## 2016-04-15 DIAGNOSIS — F418 Other specified anxiety disorders: Secondary | ICD-10-CM

## 2016-04-15 DIAGNOSIS — F329 Major depressive disorder, single episode, unspecified: Secondary | ICD-10-CM

## 2016-04-15 DIAGNOSIS — Z0000 Encounter for general adult medical examination without abnormal findings: Secondary | ICD-10-CM

## 2016-04-15 DIAGNOSIS — F419 Anxiety disorder, unspecified: Secondary | ICD-10-CM

## 2016-04-15 LAB — POCT URINALYSIS DIP (MANUAL ENTRY)
BILIRUBIN UA: NEGATIVE
Bilirubin, UA: NEGATIVE
GLUCOSE UA: NEGATIVE
Leukocytes, UA: NEGATIVE
Nitrite, UA: NEGATIVE
Protein Ur, POC: NEGATIVE
SPEC GRAV UA: 1.01
Urobilinogen, UA: 1
pH, UA: 6

## 2016-04-15 MED ORDER — SIMVASTATIN 20 MG PO TABS: 20.0000 mg | ORAL_TABLET | Freq: Every day | ORAL | 3 refills | Status: DC

## 2016-04-15 MED ORDER — AZELASTINE HCL 0.1 % NA SOLN: 2.0000 | Freq: Two times a day (BID) | NASAL | 12 refills | Status: DC

## 2016-04-15 MED ORDER — FLUTICASONE PROPIONATE 50 MCG/ACT NA SUSP: 2.0000 | Freq: Every day | NASAL | 11 refills | Status: DC

## 2016-04-15 MED ORDER — BUPROPION HCL ER (XL) 150 MG PO TB24: 150.0000 mg | ORAL_TABLET | Freq: Every day | ORAL | 1 refills | Status: DC

## 2016-04-15 NOTE — Progress Notes (Signed)
Subjective:    Patient ID: Karina Sampson, female    DOB: 05/22/1960, 55 y.o.   MRN: [id number]  04/15/2016  CPE   HPI This 55 y.o. female presents for Complete Physical Examination.  Last physical:  03-2015 Pap smear:  03-2014 Mammogram:  01-2016 Colonoscopy: 2014 Eye exam:  2017 Dental exam:  2017  Immunization History  Administered Date(s) Administered  . Influenza Split 05/12/2009, 01/10/2013, 01/24/2014  . Influenza-Unspecified 02/10/2015, 02/13/2016  . Tdap 09/05/2010  . Zoster 04/04/2015   BP Readings from Last 3 Encounters:  04/15/16 134/78  10/02/15 132/79  03/30/15 121/75   Wt Readings from Last 3 Encounters:  04/15/16 153 lb (69.4 kg)  10/02/15 170 lb (77.1 kg)  03/30/15 181 lb 3.2 oz (82.2 kg)      Review of Systems  Constitutional: Negative for activity change, appetite change, chills, diaphoresis, fatigue, fever and unexpected weight change.  HENT: Negative for congestion, dental problem, drooling, ear discharge, ear pain, facial swelling, hearing loss, mouth sores, nosebleeds, postnasal drip, rhinorrhea, sinus pressure, sneezing, sore throat, tinnitus, trouble swallowing and voice change.   Eyes: Negative for photophobia, pain, discharge, redness, itching and visual disturbance.  Respiratory: Negative for apnea, cough, choking, chest tightness, shortness of breath, wheezing and stridor.   Cardiovascular: Negative for chest pain, palpitations and leg swelling.  Gastrointestinal: Negative for abdominal distention, abdominal pain, anal bleeding, blood in stool, constipation, diarrhea, nausea, rectal pain and vomiting.  Endocrine: Negative for cold intolerance, heat intolerance, polydipsia, polyphagia and polyuria.  Genitourinary: Negative for decreased urine volume, difficulty urinating, dyspareunia, dysuria, enuresis, flank pain, frequency, genital sores, hematuria, menstrual problem, pelvic pain, urgency, vaginal bleeding, vaginal discharge and vaginal  pain.       Nocturia x 0-1. Urge incontinence.  Musculoskeletal: Negative for arthralgias, back pain, gait problem, joint swelling, myalgias, neck pain and neck stiffness.  Skin: Negative for color change, pallor, rash and wound.  Allergic/Immunologic: Negative for environmental allergies, food allergies and immunocompromised state.  Neurological: Negative for dizziness, tremors, seizures, syncope, facial asymmetry, speech difficulty, weakness, light-headedness, numbness and headaches.  Hematological: Negative for adenopathy. Does not bruise/bleed easily.  Psychiatric/Behavioral: Negative for agitation, behavioral problems, confusion, decreased concentration, dysphoric mood, hallucinations, self-injury, sleep disturbance and suicidal ideas. The patient is not nervous/anxious and is not hyperactive.        Bedtime 10:00; wakes up 6:00am.    Past Medical History:  Diagnosis Date  . Allergic rhinitis, cause unspecified   . Allergy   . Anemia, unspecified   . Anxiety   . Arthritis   . Arthropathy, unspecified, site unspecified   . Breast nodule    Right  . Depression   . Headache(784.0)   . Heart murmur   . Hypertension   . Obesity, unspecified   . Other B-complex deficiencies   . Pure hypercholesterolemia   . SVT (supraventricular tachycardia) (HCC)   . Symptomatic menopausal or female climacteric states   . Tobacco use disorder   . Ulcer disease   . Unspecified disease of nail   . Unspecified vitamin D deficiency    Past Surgical History:  Procedure Laterality Date  . ELECTROPHYSIOLOGY STUDY  1997  . EYE SURGERY  2001   LASIK- BOTH  . svt surgery     with ablation  . wisdom teeth extractions     Allergies  Allergen Reactions  . Penicillins Diarrhea and Nausea And Vomiting   Current Outpatient Prescriptions  Medication Sig Dispense Refill  . B Complex Vitamins (VITAMIN  B COMPLEX PO) Take by mouth daily.    Marland Kitchen. buPROPion (WELLBUTRIN XL) 150 MG 24 hr tablet Take 1 tablet  (150 mg total) by mouth daily. 90 tablet 1  . celecoxib (CELEBREX) 200 MG capsule Take 200 mg by mouth daily.    . Cholecalciferol (VITAMIN D3 PO) Take 4,000 Units by mouth daily.    . DULoxetine (CYMBALTA) 60 MG capsule TAKE 1 CAPSULE BY MOUTH DAILY. 30 capsule 0  . fluticasone (FLONASE) 50 MCG/ACT nasal spray Place 2 sprays into both nostrils daily. 16 g 11  . Omega-3 Fatty Acids (FISH OIL CONCENTRATE) 1000 MG CAPS Take 1,000 mg by mouth daily.    . simvastatin (ZOCOR) 20 MG tablet Take 1 tablet (20 mg total) by mouth at bedtime. 90 tablet 3  . zoster vaccine live, PF, (ZOSTAVAX) [id number] UNT/0.65ML injection Inject 19,400 Units into the skin once. 1 each 0  . aspirin 81 MG tablet Take 81 mg by mouth daily. Reported on 10/02/2015    . azelastine (ASTELIN) 0.1 % nasal spray Place 2 sprays into both nostrils 2 (two) times daily. Use in each nostril as directed 30 mL 12   No current facility-administered medications for this visit.    Social History   Social History  . Marital status: Married    Spouse name: N/A  . Number of children: 0  . Years of education: postgradua   Occupational History  . [employer]    x5170yrs   Social History Main Topics  . Smoking status: Former Smoker    Packs/day: 1.00    Types: Cigarettes    Quit date: 05/13/1995  . Smokeless tobacco: [employer]     Comment: quit 1997  . Alcohol use 1.2 oz/week    2 Cans of beer per week     Comment: occasional once a month  . Drug use: No  . Sexual activity: Yes    Birth control/ protection: None     Comment: number of sex parnters in the last 12 months 1; same sex partners   Other Topics Concern  . Not on file   Social History Narrative    Marital status: Dating same sex partner; happy, no abuse.  Married May 2017.      Children: none; partner has 57 year old daughter.  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      Lives:  With wife, stepdaughter, 3 dogs.      Employment: [employer] x 14 years.  Moderately happy.        Tobacco: none      Alcohol: rare; none; wife in recovery.      Drugs: none      Exercise: playing pickleball.  Hiking.  2-3 days per week.  Walking.      Sexual activity: sexually active with same sexual partner.      Seatbelts: 100%; no texting   Family History  Problem Relation Age of Onset  . Dementia Mother   . Depression Mother   . Alzheimer's disease Mother   . Hypertension Mother   . Aortic aneurysm Father   . Hyperlipidemia Father   . Cancer Father     skin  . Stroke Father [id number]    retinal occlusion with visual loss central  . Parkinson's disease Brother   . Parkinsonism Brother   . Arthritis Brother     L hip replacement  . Bone cancer    . Breast cancer Neg Hx  Objective:    BP 134/78 (BP Location: Right Arm, Patient Position: Sitting, Cuff Size: Small)   Pulse 90   Temp 98.7 F (37.1 C) (Oral)   Resp 16   Ht 5\' 5"  (1.651 m)   Wt 153 lb (69.4 kg)   SpO2 98%   BMI 25.46 kg/m  Physical Exam  Constitutional: She is oriented to person, place, and time. She appears well-developed and well-nourished. No distress.  HENT:  Head: Normocephalic and atraumatic.  Right Ear: External ear normal.  Left Ear: External ear normal.  Nose: Nose normal.  Mouth/Throat: Oropharynx is clear and moist.  Eyes: Conjunctivae and EOM are normal. Pupils are equal, round, and reactive to light.  Neck: Normal range of motion and full passive range of motion without pain. Neck supple. No JVD present. Carotid bruit is not present. No thyromegaly present.  Cardiovascular: Normal rate, regular rhythm and normal heart sounds.  Exam reveals no gallop and no friction rub.   No murmur heard. Pulmonary/Chest: Effort normal and breath sounds normal. She has no wheezes. She has no rales. Right breast exhibits no inverted nipple, no mass, no nipple discharge, no skin change and no tenderness. Left breast exhibits no inverted nipple, no mass, no  nipple discharge, no skin change and no tenderness. Breasts are symmetrical.  Abdominal: Soft. Bowel sounds are normal. She exhibits no distension and no mass. There is no tenderness. There is no rebound and no guarding.  Musculoskeletal:       Right shoulder: Normal.       Left shoulder: Normal.       Cervical back: Normal.  Lymphadenopathy:    She has no cervical adenopathy.  Neurological: She is alert and oriented to person, place, and time. She has normal reflexes. No cranial nerve deficit. She exhibits normal muscle tone. Coordination normal.  Skin: Skin is warm and dry. No rash noted. She is not diaphoretic. No erythema. No pallor.  Psychiatric: She has a normal mood and affect. Her behavior is normal. Judgment and thought content normal.  Nursing note and vitals reviewed.  Results for orders placed or performed in visit on 04/15/16  POCT urinalysis dipstick  Result Value Ref Range   Color, UA yellow yellow   Clarity, UA clear clear   Glucose, UA negative negative   Bilirubin, UA negative negative   Ketones, POC UA negative negative   Spec Grav, UA 1.010    Blood, UA trace-intact (A) negative   pH, UA 6.0    Protein Ur, POC negative negative   Urobilinogen, UA 1.0    Nitrite, UA Negative Negative   Leukocytes, UA Negative Negative   Fall Risk  04/15/2016 10/02/2015 03/30/2015 09/25/2014  Falls in the past year? No No No Yes  Number falls in past yr: - - - 1  Injury with Fall? - - - Yes   Depression screen [facility] 2/9 04/15/2016 10/02/2015 03/30/2015 09/25/2014  Decreased Interest 0 0 0 0  Down, Depressed, Hopeless 0 0 0 0  PHQ - 2 Score 0 0 0 0       Assessment & Plan:   1. Routine physical examination   2. Anxiety and depression   3. Other abnormal glucose   4. Pure hypercholesterolemia   5. Vitamin D deficiency   6. Overweight (BMI 25.0-29.9)   7. Chronic nonseasonal allergic rhinitis due to pollen   8. SVT (supraventricular tachycardia) (HCC)     Orders Placed This  Encounter  Procedures  . CBC with  Differential/Platelet  . Comprehensive metabolic panel    Order Specific Question:   Has the patient fasted?    Answer:   Yes  . Lipid panel    Order Specific Question:   Has the patient fasted?    Answer:   Yes  . Hemoglobin A1c  . TSH  . VITAMIN D 25 Hydroxy (Vit-D Deficiency, Fractures)  . POCT urinalysis dipstick  . EKG 12-Lead   Meds ordered this encounter  Medications  . buPROPion (WELLBUTRIN XL) 150 MG 24 hr tablet    Sig: Take 1 tablet (150 mg total) by mouth daily.    Dispense:  90 tablet    Refill:  1  . fluticasone (FLONASE) 50 MCG/ACT nasal spray    Sig: Place 2 sprays into both nostrils daily.    Dispense:  16 g    Refill:  11  . simvastatin (ZOCOR) 20 MG tablet    Sig: Take 1 tablet (20 mg total) by mouth at bedtime.    Dispense:  90 tablet    Refill:  3  . azelastine (ASTELIN) 0.1 % nasal spray    Sig: Place 2 sprays into both nostrils 2 (two) times daily. Use in each nostril as directed    Dispense:  30 mL    Refill:  12    Return in about 6 months (around 10/14/2016) for recheck high cholesterol, anxiety/depression.   Rithy Mandley Paulita Fujita, M.D. [facility] [address] [place], [place]  [address] [phone] phone [phone] fax

## 2016-04-15 NOTE — Patient Instructions (Addendum)
   IF you received an x-ray today, you will receive an invoice from [facility]. Please contact [facility] at [phone] with questions or concerns regarding your invoice.   IF you received labwork today, you will receive an invoice from [facility]. Please contact [facility] at [phone] with questions or concerns regarding your invoice.   Our billing staff will not be able to assist you with questions regarding bills from these companies.  You will be contacted with the lab results as soon as they are available. The fastest way to get your results is to activate your My Chart account. Instructions are located on the last page of this paperwork. If you have not heard from us regarding the results in 2 weeks, please contact this office.    cpe Keeping You Healthy  Get These Tests  Blood Pressure- Have your blood pressure checked by your healthcare provider at least once a year.  Normal blood pressure is 120/80.  Weight- Have your body mass index (BMI) calculated to screen for obesity.  BMI is a measure of body fat based on height and weight.  You can calculate your own BMI at www.nhlbisupport.com/bmi/  Cholesterol- Have your cholesterol checked every year.  Diabetes- Have your blood sugar checked every year if you have high blood pressure, high cholesterol, a family history of diabetes or if you are overweight.  Pap Test - Have a pap test every 1 to 5 years if you have been sexually active.  If you are older than 65 and recent pap tests have been normal you may not need additional pap tests.  In addition, if you have had a hysterectomy  for benign disease additional pap tests are not necessary.  Mammogram-Yearly mammograms are essential for early detection of breast cancer  Screening for Colon Cancer- Colonoscopy starting at age 50. Screening may begin sooner depending on your family history and other health conditions.  Follow up  colonoscopy as directed by your Gastroenterologist.  Screening for Osteoporosis- Screening begins at age 65 with bone density scanning, sooner if you are at higher risk for developing Osteoporosis.  Get these medicines  Calcium with Vitamin D- Your body requires 1200-1500 mg of Calcium a day and 800-1000 IU of Vitamin D a day.  You can only absorb 500 mg of Calcium at a time therefore Calcium must be taken in 2 or 3 separate doses throughout the day.  Hormones- Hormone therapy has been associated with increased risk for certain cancers and heart disease.  Talk to your healthcare provider about if you need relief from menopausal symptoms.  Aspirin- Ask your healthcare provider about taking Aspirin to prevent Heart Disease and Stroke.  Get these Immuniztions  Flu shot- Every fall  Pneumonia shot- Once after the age of 65; if you are younger ask your healthcare provider if you need a pneumonia shot.  Tetanus- Every ten years.  Zostavax- Once after the age of 60 to prevent shingles.  Take these steps  Don't smoke- Your healthcare provider can help you quit. For tips on how to quit, ask your healthcare provider or go to www.smokefree.gov or call 1-800 QUIT-NOW.  Be physically active- Exercise 5 days a week for a minimum of 30 minutes.  If you are not already physically active, start slow and gradually work up to 30 minutes of moderate physical activity.  Try walking, dancing, bike riding, swimming, etc.  Eat a healthy diet- Eat a variety of healthy foods such as fruits, vegetables,   whole grains, low fat milk, low fat cheeses, yogurt, lean meats, chicken, fish, eggs, dried beans, tofu, etc.  For more information go to www.thenutritionsource.org  Dental visit- Brush and floss teeth twice daily; visit your dentist twice a year.  Eye exam- Visit your Optometrist or Ophthalmologist yearly.  Drink alcohol in moderation- Limit alcohol intake to one drink or less a day.  Never drink and  drive.  Depression- Your emotional health is as important as your physical health.  If you're feeling down or losing interest in things you normally enjoy, please talk to your healthcare provider.  Seat Belts- can save your life; always wear one  Smoke/Carbon Monoxide detectors- These detectors need to be installed on the appropriate level of your home.  Replace batteries at least once a year.  Violence- If anyone is threatening or hurting you, please tell your healthcare provider.  Living Will/ Health care power of attorney- Discuss with your healthcare provider and family. 

## 2016-04-16 LAB — COMPREHENSIVE METABOLIC PANEL
ALBUMIN: 4.7 g/dL (ref 3.5–5.5)
ALT: 19 IU/L (ref 0–32)
AST: 18 IU/L (ref 0–40)
Albumin/Globulin Ratio: 1.9 (ref 1.2–2.2)
Alkaline Phosphatase: 72 IU/L (ref 39–117)
BUN / CREAT RATIO: 27 — AB (ref 9–23)
BUN: 15 mg/dL (ref 6–24)
Bilirubin Total: 0.3 mg/dL (ref 0.0–1.2)
CO2: 27 mmol/L (ref 18–29)
CREATININE: 0.55 mg/dL — AB (ref 0.57–1.00)
Calcium: 9.8 mg/dL (ref 8.7–10.2)
Chloride: 97 mmol/L (ref 96–106)
GFR calc non Af Amer: 104 mL/min/{1.73_m2} (ref >59)
GFR, EST AFRICAN AMERICAN: 120 mL/min/{1.73_m2} (ref >59)
GLUCOSE: 85 mg/dL (ref 65–99)
Globulin, Total: 2.5 g/dL (ref 1.5–4.5)
Potassium: 4.4 mmol/L (ref 3.5–5.2)
Sodium: 139 mmol/L (ref 134–144)
TOTAL PROTEIN: 7.2 g/dL (ref 6.0–8.5)

## 2016-04-16 LAB — CBC WITH DIFFERENTIAL/PLATELET
BASOS ABS: 0.1 10*3/uL (ref 0.0–0.2)
Basos: 1 %
EOS (ABSOLUTE): 0.1 10*3/uL (ref 0.0–0.4)
Eos: 2 %
HEMOGLOBIN: 12.2 g/dL (ref 11.1–15.9)
Hematocrit: 38.4 % (ref 34.0–46.6)
IMMATURE GRANULOCYTES: 0 %
Immature Grans (Abs): 0 10*3/uL (ref 0.0–0.1)
LYMPHS: 33 %
Lymphocytes Absolute: 2.2 10*3/uL (ref 0.7–3.1)
MCH: 27.7 pg (ref 26.6–33.0)
MCHC: 31.8 g/dL (ref 31.5–35.7)
MCV: 87 fL (ref 79–97)
MONOCYTES: 9 %
Monocytes Absolute: 0.6 10*3/uL (ref 0.1–0.9)
NEUTROS PCT: 55 %
Neutrophils Absolute: 3.7 10*3/uL (ref 1.4–7.0)
Platelets: 249 10*3/uL (ref 150–379)
RBC: 4.4 x10E6/uL (ref 3.77–5.28)
RDW: 14 % (ref 12.3–15.4)
WBC: 6.7 10*3/uL (ref 3.4–10.8)

## 2016-04-16 LAB — LIPID PANEL
CHOLESTEROL TOTAL: 214 mg/dL — AB (ref 100–199)
Chol/HDL Ratio: 2.7 ratio units (ref 0.0–4.4)
HDL: 79 mg/dL (ref >39)
LDL CALC: 117 mg/dL — AB (ref 0–99)
Triglycerides: 90 mg/dL (ref 0–149)
VLDL CHOLESTEROL CAL: 18 mg/dL (ref 5–40)

## 2016-04-16 LAB — TSH: TSH: 1.68 u[IU]/mL (ref 0.450–4.500)

## 2016-04-16 LAB — VITAMIN D 25 HYDROXY (VIT D DEFICIENCY, FRACTURES): Vit D, 25-Hydroxy: 47.5 ng/mL (ref 30.0–100.0)

## 2016-04-16 LAB — HEMOGLOBIN A1C
ESTIMATED AVERAGE GLUCOSE: 105 mg/dL
HEMOGLOBIN A1C: 5.3 % (ref 4.8–5.6)

## 2016-05-04 ENCOUNTER — Other Ambulatory Visit: Payer: Self-pay | Admitting: Family Medicine

## 2016-05-07 NOTE — Telephone Encounter (Signed)
04/15/16 last ov

## 2016-06-12 ENCOUNTER — Other Ambulatory Visit: Payer: Self-pay

## 2016-06-12 MED ORDER — DULOXETINE HCL 60 MG PO CPEP: 60.0000 mg | ORAL_CAPSULE | Freq: Every day | ORAL | 0 refills | Status: DC

## 2016-06-12 NOTE — Telephone Encounter (Signed)
Fax req to change to 90 day supply for Duloxetine. Verbal order from Dr. Katrinka BlazingSmith in clinic sent

## 2016-09-05 ENCOUNTER — Other Ambulatory Visit: Payer: Self-pay | Admitting: Family Medicine

## 2016-09-06 MED ORDER — DULOXETINE HCL 60 MG PO CPEP: 60.0000 mg | ORAL_CAPSULE | Freq: Every day | ORAL | 0 refills | Status: DC

## 2016-09-06 NOTE — Telephone Encounter (Signed)
My chart req refill Cymbalta Sent #[id number] to last till 67mo time frame for follow up

## 2016-10-14 ENCOUNTER — Encounter: Payer: Self-pay | Admitting: Family Medicine

## 2016-10-14 ENCOUNTER — Ambulatory Visit (INDEPENDENT_AMBULATORY_CARE_PROVIDER_SITE_OTHER): Payer: 59 | Admitting: Family Medicine

## 2016-10-14 VITALS — BP 142/85 | HR 78 | Temp 98.3°F | Resp 14 | Ht 65.0 in | Wt 157.4 lb

## 2016-10-14 DIAGNOSIS — F329 Major depressive disorder, single episode, unspecified: Secondary | ICD-10-CM

## 2016-10-14 DIAGNOSIS — F419 Anxiety disorder, unspecified: Secondary | ICD-10-CM | POA: Diagnosis not present

## 2016-10-14 DIAGNOSIS — E7439 Other disorders of intestinal carbohydrate absorption: Secondary | ICD-10-CM

## 2016-10-14 DIAGNOSIS — Z6826 Body mass index (BMI) 26.0-26.9, adult: Secondary | ICD-10-CM

## 2016-10-14 DIAGNOSIS — F32A Depression, unspecified: Secondary | ICD-10-CM

## 2016-10-14 DIAGNOSIS — E7800 Pure hypercholesterolemia, unspecified: Secondary | ICD-10-CM | POA: Diagnosis not present

## 2016-10-14 MED ORDER — BUPROPION HCL ER (XL) 150 MG PO TB24: 150.0000 mg | ORAL_TABLET | Freq: Every day | ORAL | 1 refills | Status: DC

## 2016-10-14 MED ORDER — DULOXETINE HCL 60 MG PO CPEP: 60.0000 mg | ORAL_CAPSULE | Freq: Every day | ORAL | 1 refills | Status: DC

## 2016-10-14 NOTE — Progress Notes (Signed)
Subjective:    Patient ID: Karina Sampson, female    DOB: Aug 05, 1960, 56 y.o.   MRN: [id number]  10/14/2016  Follow-up (Anxiety/Depression, Cholesterol. Needs note for insurance stating that pt had physical at date of lst physical/)   HPI This 56 y.o. female presents for evaluation of anxiety and depression and hypercholesterolemia.  Patient reports good compliance with medication, good tolerance to medication, and good symptom control.   Blood pressure not monitoring at home.  At rheumatology.    Mother at nursing home for hip fracture last year; never rehab well. Never recovered.  Has COPD tobacco abuse.  Age 56.     Brother with Parkinson's disease, has idiopathic pulmonary fibrosis. Looking for lung transplant.  Will not extend life more if at all.  Not on oxygen yet.  Candidate for lung transplant.    Father with word finding difficulties.    Just went up two weeks ago.  Family medical mess.   Wife just diagnosed with Sjogren's.    Goes back and forth between Astelin and Flonase.  Immunization History  Administered Date(s) Administered  . Influenza Split 05/12/2009, 01/10/2013, 01/24/2014  . Influenza-Unspecified 02/10/2015, 02/13/2016  . Tdap 09/05/2010  . Zoster 04/04/2015   BP Readings from Last 3 Encounters:  10/14/16 (!) 142/85  04/15/16 134/78  10/02/15 132/79   Wt Readings from Last 3 Encounters:  10/14/16 157 lb 6.4 oz (71.4 kg)  04/15/16 153 lb (69.4 kg)  10/02/15 170 lb (77.1 kg)     Review of Systems  Constitutional: Negative for chills, diaphoresis, fatigue and fever.  Eyes: Negative for visual disturbance.  Respiratory: Negative for cough and shortness of breath.   Cardiovascular: Negative for chest pain, palpitations and leg swelling.  Gastrointestinal: Negative for abdominal pain, constipation, diarrhea, nausea and vomiting.  Endocrine: Negative for cold intolerance, heat intolerance, polydipsia, polyphagia and polyuria.  Neurological: Negative for  dizziness, tremors, seizures, syncope, facial asymmetry, speech difficulty, weakness, light-headedness, numbness and headaches.    Past Medical History:  Diagnosis Date  . Allergic rhinitis, cause unspecified   . Allergy   . Anemia, unspecified   . Anxiety   . Arthritis   . Arthropathy, unspecified, site unspecified   . Breast nodule    Right  . Depression   . Headache(784.0)   . Heart murmur   . Hypertension   . Obesity, unspecified   . Other B-complex deficiencies   . Pure hypercholesterolemia   . SVT (supraventricular tachycardia) ([place])   . Symptomatic menopausal or female climacteric states   . Tobacco use disorder   . Ulcer disease   . Unspecified disease of nail   . Unspecified vitamin D deficiency    Past Surgical History:  Procedure Laterality Date  . ELECTROPHYSIOLOGY STUDY  1997  . EYE SURGERY  2001   LASIK- BOTH  . svt surgery     with ablation  . wisdom teeth extractions     Allergies  Allergen Reactions  . Penicillins Diarrhea and Nausea And Vomiting    Social History   Social History  . Marital status: Married    Spouse name: N/A  . Number of children: 0  . Years of education: postgradua   Occupational History  . [employer]    x58yr   Social History Main Topics  . Smoking status: Former Smoker    Packs/day: 1.00    Types: Cigarettes    Quit date: 05/13/1995  . Smokeless tobacco: Former USystems developer  Comment: quit 1997  . Alcohol use 1.2 oz/week    2 Cans of beer per week     Comment: occasional once a month  . Drug use: No  . Sexual activity: Yes    Birth control/ protection: None     Comment: number of sex parnters in the last 35 months 1; same sex partners   Other Topics Concern  . Not on file   Social History Narrative    Marital status: Dating same sex partner; happy, no abuse.  Married May 2017.      Children: none; partner has 57 year old daughter.  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      Lives:  With  wife, stepdaughter, 3 dogs.      Employment: [employer] x 14 years.  Moderately happy.        Tobacco: none      Alcohol: rare; none; wife in recovery.      Drugs: none      Exercise: playing pickleball.  Hiking.  2-3 days per week.  Walking.      Sexual activity: sexually active with same sexual partner.      Seatbelts: 100%; no texting   Family History  Problem Relation Age of Onset  . Dementia Mother   . Depression Mother   . Alzheimer's disease Mother   . Hypertension Mother   . COPD Mother   . Aortic aneurysm Father   . Hyperlipidemia Father   . Cancer Father        skin  . Stroke Father 63       retinal occlusion with visual loss central  . Dementia Father   . Parkinson's disease Brother   . Parkinsonism Brother   . Pulmonary fibrosis Brother   . Arthritis Brother        L hip replacement  . Bone cancer Unknown   . Breast cancer Neg Hx        Objective:    BP (!) 142/85   Pulse 78   Temp 98.3 F (36.8 C) (Oral)   Resp 14   Ht 5' 5"  (1.651 m)   Wt 157 lb 6.4 oz (71.4 kg)   SpO2 97%   BMI 26.19 kg/m  Physical Exam  Constitutional: She is oriented to person, place, and time. She appears well-developed and well-nourished. No distress.  HENT:  Head: Normocephalic and atraumatic.  Right Ear: External ear normal.  Left Ear: External ear normal.  Nose: Nose normal.  Mouth/Throat: Oropharynx is clear and moist.  Eyes: Conjunctivae and EOM are normal. Pupils are equal, round, and reactive to light.  Neck: Normal range of motion. Neck supple. Carotid bruit is not present. No thyromegaly present.  Cardiovascular: Normal rate, regular rhythm, normal heart sounds and intact distal pulses.  Exam reveals no gallop and no friction rub.   No murmur heard. Pulmonary/Chest: Effort normal and breath sounds normal. She has no wheezes. She has no rales.  Abdominal: Soft. Bowel sounds are normal. She exhibits no distension and no mass. There is no tenderness.  There is no rebound and no guarding.  Lymphadenopathy:    She has no cervical adenopathy.  Neurological: She is alert and oriented to person, place, and time. No cranial nerve deficit.  Skin: Skin is warm and dry. No rash noted. She is not diaphoretic. No erythema. No pallor.  Psychiatric: She has a normal mood and affect. Her behavior is normal.   Depression screen [facility] 2/9 10/14/2016 04/15/2016  10/02/2015 03/30/2015 09/25/2014  Decreased Interest 0 0 0 0 0  Down, Depressed, Hopeless 0 0 0 0 0  PHQ - 2 Score 0 0 0 0 0        Assessment & Plan:   1. Anxiety and depression   2. Pure hypercholesterolemia   3. Glucose intolerance   4. BMI 26.0-26.9,adult    -anxiety controlled; continue current medications. -cholesterol elevated at last visit; repeat today; pt reports no change in diet or exercise. -encourage ongoing weight loss, exercise, and dietary restrictions with low carbohydrate food choices.  Orders Placed This Encounter  Procedures  . Comprehensive metabolic panel    Order Specific Question:   Has the patient fasted?    Answer:   Yes  . Lipid panel    Order Specific Question:   Has the patient fasted?    Answer:   Yes  . Hemoglobin A1c   Meds ordered this encounter  Medications  . buPROPion (WELLBUTRIN XL) 150 MG 24 hr tablet    Sig: Take 1 tablet (150 mg total) by mouth daily.    Dispense:  90 tablet    Refill:  1  . DULoxetine (CYMBALTA) 60 MG capsule    Sig: Take 1 capsule (60 mg total) by mouth daily. Return for follow up June 2018    Dispense:  90 capsule    Refill:  1    Return in about 6 months (around 04/15/2017) for complete physical examiniation.   Levada Bowersox Elayne Guerin, M.D. Primary Care at [facility] previously [facility] [address] [place], [place]  [address] [phone] phone [phone] fax

## 2016-10-14 NOTE — Patient Instructions (Signed)
     IF you received an x-ray today, you will receive an invoice from [facility]. Please contact [facility] at [phone] with questions or concerns regarding your invoice.   IF you received labwork today, you will receive an invoice from [facility]. Please contact [facility] at [phone] with questions or concerns regarding your invoice.   Our billing staff will not be able to assist you with questions regarding bills from these companies.  You will be contacted with the lab results as soon as they are available. The fastest way to get your results is to activate your My Chart account. Instructions are located on the last page of this paperwork. If you have not heard from us regarding the results in 2 weeks, please contact this office.     

## 2016-10-15 LAB — COMPREHENSIVE METABOLIC PANEL
A/G RATIO: 2.1 (ref 1.2–2.2)
ALBUMIN: 4.7 g/dL (ref 3.5–5.5)
ALT: 20 IU/L (ref 0–32)
AST: 20 IU/L (ref 0–40)
Alkaline Phosphatase: 75 IU/L (ref 39–117)
BILIRUBIN TOTAL: 0.4 mg/dL (ref 0.0–1.2)
BUN / CREAT RATIO: 17 (ref 9–23)
BUN: 10 mg/dL (ref 6–24)
CALCIUM: 9.6 mg/dL (ref 8.7–10.2)
CHLORIDE: 100 mmol/L (ref 96–106)
CO2: 27 mmol/L (ref 18–29)
Creatinine, Ser: 0.6 mg/dL (ref 0.57–1.00)
GFR calc Af Amer: 116 mL/min/{1.73_m2} (ref >59)
GFR, EST NON AFRICAN AMERICAN: 101 mL/min/{1.73_m2} (ref >59)
Globulin, Total: 2.2 g/dL (ref 1.5–4.5)
Glucose: 86 mg/dL (ref 65–99)
POTASSIUM: 4.2 mmol/L (ref 3.5–5.2)
Sodium: 141 mmol/L (ref 134–144)
Total Protein: 6.9 g/dL (ref 6.0–8.5)

## 2016-10-15 LAB — LIPID PANEL
CHOL/HDL RATIO: 2.4 ratio (ref 0.0–4.4)
Cholesterol, Total: 195 mg/dL (ref 100–199)
HDL: 81 mg/dL (ref >39)
LDL Calculated: 102 mg/dL — ABNORMAL HIGH (ref 0–99)
Triglycerides: 59 mg/dL (ref 0–149)
VLDL CHOLESTEROL CAL: 12 mg/dL (ref 5–40)

## 2016-10-15 LAB — HEMOGLOBIN A1C
ESTIMATED AVERAGE GLUCOSE: 105 mg/dL
HEMOGLOBIN A1C: 5.3 % (ref 4.8–5.6)

## 2017-01-13 ENCOUNTER — Encounter: Payer: Self-pay | Admitting: Family Medicine

## 2017-03-30 ENCOUNTER — Other Ambulatory Visit: Payer: Self-pay | Admitting: Family Medicine

## 2017-03-30 DIAGNOSIS — Z1231 Encounter for screening mammogram for malignant neoplasm of breast: Secondary | ICD-10-CM

## 2017-04-08 ENCOUNTER — Ambulatory Visit
Admission: RE | Admit: 2017-04-08 | Discharge: 2017-04-08 | Disposition: A | Discharge status: Home / Self Care | Payer: PRIVATE HEALTH INSURANCE | Source: Ambulatory Visit | Attending: Family Medicine | Admitting: Family Medicine

## 2017-04-08 DIAGNOSIS — Z1231 Encounter for screening mammogram for malignant neoplasm of breast: Secondary | ICD-10-CM

## 2017-04-10 ENCOUNTER — Encounter: Payer: Self-pay | Admitting: Family Medicine

## 2017-04-10 ENCOUNTER — Ambulatory Visit (INDEPENDENT_AMBULATORY_CARE_PROVIDER_SITE_OTHER): Payer: PRIVATE HEALTH INSURANCE

## 2017-04-10 ENCOUNTER — Ambulatory Visit (INDEPENDENT_AMBULATORY_CARE_PROVIDER_SITE_OTHER): Payer: PRIVATE HEALTH INSURANCE | Admitting: Family Medicine

## 2017-04-10 ENCOUNTER — Other Ambulatory Visit: Payer: Self-pay

## 2017-04-10 VITALS — BP 138/82 | HR 105 | Temp 98.0°F | Resp 16 | Ht 65.35 in | Wt 165.0 lb

## 2017-04-10 DIAGNOSIS — F32A Depression, unspecified: Secondary | ICD-10-CM

## 2017-04-10 DIAGNOSIS — Z6827 Body mass index (BMI) 27.0-27.9, adult: Secondary | ICD-10-CM

## 2017-04-10 DIAGNOSIS — F419 Anxiety disorder, unspecified: Secondary | ICD-10-CM

## 2017-04-10 DIAGNOSIS — M5030 Other cervical disc degeneration, unspecified cervical region: Secondary | ICD-10-CM

## 2017-04-10 DIAGNOSIS — J3089 Other allergic rhinitis: Secondary | ICD-10-CM

## 2017-04-10 DIAGNOSIS — E7800 Pure hypercholesterolemia, unspecified: Secondary | ICD-10-CM

## 2017-04-10 DIAGNOSIS — Z124 Encounter for screening for malignant neoplasm of cervix: Secondary | ICD-10-CM | POA: Diagnosis not present

## 2017-04-10 DIAGNOSIS — E7439 Other disorders of intestinal carbohydrate absorption: Secondary | ICD-10-CM

## 2017-04-10 DIAGNOSIS — F329 Major depressive disorder, single episode, unspecified: Secondary | ICD-10-CM | POA: Diagnosis not present

## 2017-04-10 DIAGNOSIS — I471 Supraventricular tachycardia: Secondary | ICD-10-CM | POA: Diagnosis not present

## 2017-04-10 DIAGNOSIS — E663 Overweight: Secondary | ICD-10-CM | POA: Diagnosis not present

## 2017-04-10 DIAGNOSIS — Z0000 Encounter for general adult medical examination without abnormal findings: Secondary | ICD-10-CM | POA: Diagnosis not present

## 2017-04-10 LAB — POCT URINALYSIS DIP (MANUAL ENTRY)
Bilirubin, UA: NEGATIVE
Glucose, UA: NEGATIVE mg/dL
LEUKOCYTES UA: NEGATIVE
NITRITE UA: NEGATIVE
PH UA: 7 (ref 5.0–8.0)
PROTEIN UA: NEGATIVE mg/dL
Spec Grav, UA: 1.01 (ref 1.010–1.025)
Urobilinogen, UA: 0.2 E.U./dL

## 2017-04-10 MED ORDER — BUPROPION HCL ER (XL) 150 MG PO TB24: 150.0000 mg | ORAL_TABLET | Freq: Every day | ORAL | 1 refills | Status: DC

## 2017-04-10 MED ORDER — DULOXETINE HCL 60 MG PO CPEP: 60.0000 mg | ORAL_CAPSULE | Freq: Every day | ORAL | 1 refills | Status: DC

## 2017-04-10 MED ORDER — TRAZODONE HCL 50 MG PO TABS: 50.0000 mg | ORAL_TABLET | Freq: Every evening | ORAL | 1 refills | Status: DC | PRN

## 2017-04-10 MED ORDER — METHOCARBAMOL 500 MG PO TABS: 500.0000 mg | ORAL_TABLET | Freq: Four times a day (QID) | ORAL | 0 refills | Status: DC

## 2017-04-10 MED ORDER — FLUTICASONE PROPIONATE 50 MCG/ACT NA SUSP: 2.0000 | Freq: Every day | NASAL | 11 refills | Status: AC

## 2017-04-10 MED ORDER — PREDNISONE 20 MG PO TABS: ORAL_TABLET | ORAL | 0 refills | Status: DC

## 2017-04-10 MED ORDER — SIMVASTATIN 20 MG PO TABS: 20.0000 mg | ORAL_TABLET | Freq: Every day | ORAL | 3 refills | Status: DC

## 2017-04-10 NOTE — Progress Notes (Signed)
Subjective:    Patient ID: Karina ReddenVicky L Sampson, female    DOB: 03/21/1961, 56 y.o.   MRN: [id number]  04/10/2017  Annual Exam    HPI` This 56 y.o. female presents for Complete Physical Examination.  Last physical: 04/15/2016 Pap smear:  03/2014 Mammogram:  02/05/2016, 04/08/2017 Colonoscopy:  04/21/2013 Eye exam:  None; readers; cataracts Dental exam:  Next month.   Visual Acuity Screening   Right eye Left eye Both eyes  Without correction: 20/40 20/40 20/20   With correction:       BP Readings from Last 3 Encounters:  04/10/17 138/82  10/14/16 (!) 142/85  04/15/16 134/78   Wt Readings from Last 3 Encounters:  04/10/17 165 lb (74.8 kg)  10/14/16 157 lb 6.4 oz (71.4 kg)  04/15/16 153 lb (69.4 kg)   Immunization History  Administered Date(s) Administered  . Influenza Split 05/12/2009, 01/10/2013, 01/24/2014  . Influenza-Unspecified 02/10/2015, 02/13/2016, 02/24/2017  . Tdap 09/05/2010  . Zoster 04/04/2015   Neck pain:  Onset one week ago.  No trigger.  Radiating into shoulder and down arm. RIGHT.  +numbness/tingling/burning.  Taking Ibuprofen 800mg  once daily.   S/p consultation in 2016 for neck pain; s/p MRI cervical spine that showed diffuse DDD.    Insomnia: horrible issues at work.  Melatonin but not effective.    Anxiety and depression; Patient reports good compliance with medication, good tolerance to medication, and good symptom control.      Review of Systems  Constitutional: Negative for activity change, appetite change, chills, diaphoresis, fatigue, fever and unexpected weight change.  HENT: Negative for congestion, dental problem, drooling, ear discharge, ear pain, facial swelling, hearing loss, mouth sores, nosebleeds, postnasal drip, rhinorrhea, sinus pressure, sneezing, sore throat, tinnitus, trouble swallowing and voice change.   Eyes: Negative for photophobia, pain, discharge, redness, itching and visual disturbance.  Respiratory: Negative for apnea,  cough, choking, chest tightness, shortness of breath, wheezing and stridor.   Cardiovascular: Negative for chest pain, palpitations and leg swelling.  Gastrointestinal: Negative for abdominal distention, abdominal pain, anal bleeding, blood in stool, constipation, diarrhea, nausea, rectal pain and vomiting.  Endocrine: Negative for cold intolerance, heat intolerance, polydipsia, polyphagia and polyuria.  Genitourinary: Negative for decreased urine volume, difficulty urinating, dyspareunia, dysuria, enuresis, flank pain, frequency, genital sores, hematuria, menstrual problem, pelvic pain, urgency, vaginal bleeding, vaginal discharge and vaginal pain.  Musculoskeletal: Positive for neck pain and neck stiffness. Negative for arthralgias, back pain, gait problem, joint swelling and myalgias.  Skin: Negative for color change, pallor, rash and wound.  Allergic/Immunologic: Negative for environmental allergies, food allergies and immunocompromised state.  Neurological: Negative for dizziness, tremors, seizures, syncope, facial asymmetry, speech difficulty, weakness, light-headedness, numbness and headaches.  Hematological: Negative for adenopathy. Does not bruise/bleed easily.  Psychiatric/Behavioral: Positive for dysphoric mood and sleep disturbance. Negative for agitation, behavioral problems, confusion, decreased concentration, hallucinations, self-injury and suicidal ideas. The patient is nervous/anxious. The patient is not hyperactive.     Past Medical History:  Diagnosis Date  . Allergic rhinitis, cause unspecified   . Allergy   . Anemia, unspecified   . Anxiety   . Arthritis   . Arthropathy, unspecified, site unspecified   . Breast nodule    Right  . Depression   . Headache(784.0)   . Heart murmur   . Hypertension   . Obesity, unspecified   . Other B-complex deficiencies   . Pure hypercholesterolemia   . SVT (supraventricular tachycardia) (H[facility])   . Symptomatic menopausal or female  climacteric states   .  Tobacco use disorder   . Ulcer disease   . Unspecified disease of nail   . Unspecified vitamin D deficiency    Past Surgical History:  Procedure Laterality Date  . ELECTROPHYSIOLOGY STUDY  1997  . EYE SURGERY  2001   LASIK- BOTH  . svt surgery     with ablation  . wisdom teeth extractions     Allergies  Allergen Reactions  . Penicillins Diarrhea and Nausea And Vomiting   Current Outpatient Medications on File Prior to Visit  Medication Sig Dispense Refill  . B Complex Vitamins (VITAMIN B COMPLEX PO) Take by mouth daily.    . celecoxib (CELEBREX) 200 MG capsule Take 200 mg by mouth daily.    . Cholecalciferol (VITAMIN D3 PO) Take 4,000 Units by mouth daily.    . Omega-3 Fatty Acids (FISH OIL CONCENTRATE) 1000 MG CAPS Take 1,000 mg by mouth daily.    . polycarbophil (FIBERCON) 625 MG tablet Take 625 mg by mouth daily.     No current facility-administered medications on file prior to visit.    Social History   Socioeconomic History  . Marital status: Married    Spouse name: Not on file  . Number of children: 0  . Years of education: postgradua  . Highest education level: Not on file  Social Needs  . Financial resource strain: Not on file  . Food insecurity - worry: Not on file  . Food insecurity - inability: Not on file  . Transportation needs - medical: Not on file  . Transportation needs - non-medical: Not on file  Occupational History  . Occupation: [employer]    Employer: [employer]    Comment: x5640yrs  Tobacco Use  . Smoking status: Former Smoker    Packs/day: 1.00    Types: Cigarettes    Last attempt to quit: 05/13/1995    Years since quitting: 21.9  . Smokeless tobacco: Former NeurosurgeonUser  . Tobacco comment: quit 1997  Substance and Sexual Activity  . Alcohol use: Yes    Alcohol/week: 1.2 oz    Types: 2 Cans of beer per week    Comment: occasional once a month  . Drug use: No  . Sexual activity: Yes    Birth  control/protection: None    Comment: number of sex parnters in the last 12 months 1; same sex partners  Other Topics Concern  . Not on file  Social History Narrative    Marital status:  Married May 2017.      Children: none; partner has 58 year old daughter.  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      Lives:  With wife, stepdaughter, 3 dogs.      Employment: [employer] x 14 years.  Moderately happy.        Tobacco: none      Alcohol: rare; none; wife in recovery.      Drugs: none      Exercise: playing pickleball.  Hiking.  2-3 days per week.  Walking.      Sexual activity: sexually active with same sexual partner.      Seatbelts: 100%; no texting   Family History  Problem Relation Age of Onset  . Dementia Mother   . Depression Mother   . Alzheimer's disease Mother   . Hypertension Mother   . COPD Mother   . Aortic aneurysm Father   . Hyperlipidemia Father   . Cancer Father  skin  . Stroke Father 39       retinal occlusion with visual loss central  . Dementia Father   . Parkinson's disease Brother   . Parkinsonism Brother   . Pulmonary fibrosis Brother   . Arthritis Brother        L hip replacement  . Bone cancer Unknown   . Breast cancer Neg Hx        Objective:    BP 138/82   Pulse (!) 105   Temp 98 F (36.7 C)   Resp 16   Ht 5' 5.35" (1.66 m)   Wt 165 lb (74.8 kg)   SpO2 98%   BMI 27.16 kg/m  Physical Exam  Constitutional: She is oriented to person, place, and time. She appears well-developed and well-nourished. No distress.  HENT:  Head: Normocephalic and atraumatic.  Right Ear: External ear normal.  Left Ear: External ear normal.  Nose: Nose normal.  Mouth/Throat: Oropharynx is clear and moist.  Eyes: Conjunctivae and EOM are normal. Pupils are equal, round, and reactive to light.  Neck: Normal range of motion and full passive range of motion without pain. Neck supple. No JVD present. Carotid bruit is not present. No thyromegaly present.   Cardiovascular: Normal rate, regular rhythm and normal heart sounds. Exam reveals no gallop and no friction rub.  No murmur heard. Pulmonary/Chest: Effort normal and breath sounds normal. She has no wheezes. She has no rales. Right breast exhibits no inverted nipple, no mass, no nipple discharge, no skin change and no tenderness. Left breast exhibits no inverted nipple, no mass, no nipple discharge, no skin change and no tenderness. Breasts are symmetrical.  Abdominal: Soft. Bowel sounds are normal. She exhibits no distension and no mass. There is no tenderness. There is no rebound and no guarding.  Genitourinary: Vagina normal. There is no rash, tenderness, lesion or injury on the right labia. There is no rash, tenderness, lesion or injury on the left labia. No signs of injury around the vagina.  Musculoskeletal:       Right shoulder: Normal.       Left shoulder: Normal.       Cervical back: Normal.  Lymphadenopathy:    She has no cervical adenopathy.  Neurological: She is alert and oriented to person, place, and time. She has normal reflexes. No cranial nerve deficit. She exhibits normal muscle tone. Coordination normal.  Skin: Skin is warm and dry. No rash noted. She is not diaphoretic. No erythema. No pallor.  Psychiatric: She has a normal mood and affect. Her behavior is normal. Judgment and thought content normal.  Nursing note and vitals reviewed.  Dg Cervical Spine Complete  Result Date: 04/10/2017 CLINICAL DATA:  Neck pain radiating into the right arm. EXAM: CERVICAL SPINE - COMPLETE 4+ VIEW COMPARISON:  MRI cervical spine dated September 04, 2014. Cervical spine x-rays dated October 24, 2014. FINDINGS: No acute fracture or malalignment. Vertebral body heights are preserved. Straightening of the normal cervical lordosis. Moderate multilevel disc height loss and facet uncovertebral hypertrophy is similar to prior study. Multilevel moderate neuroforaminal stenoses, left worse than right. Normal  prevertebral soft tissues. IMPRESSION: Moderate multilevel degenerative changes throughout the cervical spine, overall similar to prior study. No acute osseous abnormality. Electronically Signed   By: Obie Dredge M.D.   On: 04/10/2017 12:17   Depression screen [facility] 2/9 04/10/2017 10/14/2016 04/15/2016 10/02/2015 03/30/2015  Decreased Interest 0 0 0 0 0  Down, Depressed, Hopeless 0 0 0 0 0  PHQ - 2 Score 0 0 0 0 0   Fall Risk  04/10/2017 10/14/2016 04/15/2016 10/02/2015 03/30/2015  Falls in the past year? No No No No No  Number falls in past yr: - - - - -  Injury with Fall? - - - - -  Comment - - - - -        Assessment & Plan:   1. Routine physical examination   2. Pure hypercholesterolemia   3. Anxiety and depression   4. Overweight (BMI 25.0-29.9)   5. Glucose intolerance   6. SVT (supraventricular tachycardia) ([facility]CC) Chronic  7. Degenerative disc disease, cervical   8. Screening for cervical cancer   9. Chronic nonseasonal allergic rhinitis due to pollen   10. BMI 27.0-27.9,adult    -anticipatory guidance provided --- exercise, weight loss, safe driving practices, aspirin 81mg  daily. -obtain age appropriate screening labs and labs for chronic disease management. -Pap smear obtained for cervical cancer screening.  Well-controlled hypercholesterolemia and anxiety with depression.  Obtain labs for chronic disease management.  Refills provided today.  No changes in medications. -New onset insomnia due to stressors at work and also due to neck pain.  Prescription for trazodone provided to take for insomnia.  Also provided methocarbamol for neck pain.  Recommend using either trazodone or methocarbamol to assist with insomnia. -New onset neck pain.  History of degenerative disc disease of cervical spine.  Status post MRI in 2016 per orthopedics.  At that time, prednisone and physical therapy greatly beneficial.  Obtain cervical spine films today.  Treat with prednisone and methocarbamol.   Patient to reinitiate home exercise program.  Continue heat to neck daily.  If no improvement in 4 weeks, contact office for referral to orthopedics. -recommend weight loss, exercise for 30-60 minutes five days per week; recommend 1200 kcal restriction per day with a minimum of 60 grams of protein per day.    Orders Placed This Encounter  Procedures  . DG Cervical Spine Complete    Standing Status:   Future    Number of Occurrences:   1    Standing Expiration Date:   04/10/2018    Order Specific Question:   Reason for Exam (SYMPTOM  OR DIAGNOSIS REQUIRED)    Answer:   neck pain with radiation into R arm    Order Specific Question:   Is the patient pregnant?    Answer:   No    Order Specific Question:   Preferred imaging location?    Answer:   External  . CBC with Differential/Platelet  . Hemoglobin A1c  . Lipid panel    Order Specific Question:   Has the patient fasted?    Answer:   No  . Comprehensive metabolic panel    Order Specific Question:   Has the patient fasted?    Answer:   No  . TSH  . POCT urinalysis dipstick   Meds ordered this encounter  Medications  . predniSONE (DELTASONE) 20 MG tablet    Sig: Take 3 PO QAM x 1 day, 2 PO QAM x 5 days, 1 PO QAM x 5 days    Dispense:  18 tablet    Refill:  0  . methocarbamol (ROBAXIN) 500 MG tablet    Sig: Take 1-2 tablets (500-1,000 mg total) by mouth 4 (four) times daily.    Dispense:  40 tablet    Refill:  0  . buPROPion (WELLBUTRIN XL) 150 MG 24 hr tablet    Sig: Take 1  tablet (150 mg total) by mouth daily.    Dispense:  90 tablet    Refill:  1  . DULoxetine (CYMBALTA) 60 MG capsule    Sig: Take 1 capsule (60 mg total) by mouth daily. Return for follow up June 2018    Dispense:  90 capsule    Refill:  1  . fluticasone (FLONASE) 50 MCG/ACT nasal spray    Sig: Place 2 sprays into both nostrils daily.    Dispense:  16 g    Refill:  11  . simvastatin (ZOCOR) 20 MG tablet    Sig: Take 1 tablet (20 mg total) by mouth at  bedtime.    Dispense:  90 tablet    Refill:  3  . traZODone (DESYREL) 50 MG tablet    Sig: Take 1 tablet (50 mg total) by mouth at bedtime as needed for sleep.    Dispense:  90 tablet    Refill:  1    Return in about 6 months (around 10/08/2017) for follow-up chronic medical conditions.   Kirstin Kugler Paulita Fujita, M.D. Primary Care at [facility] previously [facility] [address] [place], [place]  [address] [phone] phone [phone] fax

## 2017-04-10 NOTE — Patient Instructions (Addendum)
IF you received an x-ray today, you will receive an invoice from [facility]. Please contact [facility] at [phone] with questions or concerns regarding your invoice.   IF you received labwork today, you will receive an invoice from [facility]. Please contact [facility] at [phone] with questions or concerns regarding your invoice.   Our billing staff will not be able to assist you with questions regarding bills from these companies.  You will be contacted with the lab results as soon as they are available. The fastest way to get your results is to activate your My Chart account. Instructions are located on the last page of this paperwork. If you have not heard from [place] regarding the results in 2 weeks, please contact this office.      Preventive Care 40-64 Years, Female Preventive care refers to lifestyle choices and visits with your health care provider that can promote health and wellness. What does preventive care include?  A yearly physical exam. This is also called an annual well check.  Dental exams once or twice a year.  Routine eye exams. Ask your health care provider how often you should have your eyes checked.  Personal lifestyle choices, including: ? Daily care of your teeth and gums. ? Regular physical activity. ? Eating a healthy diet. ? Avoiding tobacco and drug use. ? Limiting alcohol use. ? Practicing safe sex. ? Taking low-dose aspirin daily starting at age 34. ? Taking vitamin and mineral supplements as recommended by your health care provider. What happens during an annual well check? The services and screenings done by your health care provider during your annual well check will depend on your age, overall health, lifestyle risk factors, and family history of disease. Counseling Your health care provider may ask you questions about your:  Alcohol use.  Tobacco use.  Drug use.  Emotional well-being.  Home and relationship  well-being.  Sexual activity.  Eating habits.  Work and work Statistician.  Method of birth control.  Menstrual cycle.  Pregnancy history.  Screening You may have the following tests or measurements:  Height, weight, and BMI.  Blood pressure.  Lipid and cholesterol levels. These may be checked every 5 years, or more frequently if you are over 65 years old.  Skin check.  Lung cancer screening. You may have this screening every year starting at age 102 if you have a 30-pack-year history of smoking and currently smoke or have quit within the past 15 years.  Fecal occult blood test (FOBT) of the stool. You may have this test every year starting at age 33.  Flexible sigmoidoscopy or colonoscopy. You may have a sigmoidoscopy every 5 years or a colonoscopy every 10 years starting at age 23.  Hepatitis C blood test.  Hepatitis B blood test.  Sexually transmitted disease (STD) testing.  Diabetes screening. This is done by checking your blood sugar (glucose) after you have not eaten for a while (fasting). You may have this done every 1-3 years.  Mammogram. This may be done every 1-2 years. Talk to your health care provider about when you should start having regular mammograms. This may depend on whether you have a family history of breast cancer.  BRCA-related cancer screening. This may be done if you have a family history of breast, ovarian, tubal, or peritoneal cancers.  Pelvic exam and Pap test. This may be done every 3 years starting at age 3. Starting at age 55, this may be done every 5 years if  you have a Pap test in combination with an HPV test.  Bone density scan. This is done to screen for osteoporosis. You may have this scan if you are at high risk for osteoporosis.  Discuss your test results, treatment options, and if necessary, the need for more tests with your health care provider. Vaccines Your health care provider may recommend certain vaccines, such  as:  Influenza vaccine. This is recommended every year.  Tetanus, diphtheria, and acellular pertussis (Tdap, Td) vaccine. You may need a Td booster every 10 years.  Varicella vaccine. You may need this if you have not been vaccinated.  Zoster vaccine. You may need this after age 39.  Measles, mumps, and rubella (MMR) vaccine. You may need at least one dose of MMR if you were born in 1957 or later. You may also need a second dose.  Pneumococcal 13-valent conjugate (PCV13) vaccine. You may need this if you have certain conditions and were not previously vaccinated.  Pneumococcal polysaccharide (PPSV23) vaccine. You may need one or two doses if you smoke cigarettes or if you have certain conditions.  Meningococcal vaccine. You may need this if you have certain conditions.  Hepatitis A vaccine. You may need this if you have certain conditions or if you travel or work in places where you may be exposed to hepatitis A.  Hepatitis B vaccine. You may need this if you have certain conditions or if you travel or work in places where you may be exposed to hepatitis B.  Haemophilus influenzae type b (Hib) vaccine. You may need this if you have certain conditions.  Talk to your health care provider about which screenings and vaccines you need and how often you need them. This information is not intended to replace advice given to you by your health care provider. Make sure you discuss any questions you have with your health care provider. Document Released: 05/25/2015 Document Revised: 01/16/2016 Document Reviewed: 02/27/2015 Elsevier Interactive Patient Education  2017 [employer].

## 2017-04-11 LAB — HEMOGLOBIN A1C
ESTIMATED AVERAGE GLUCOSE: 108 mg/dL
HEMOGLOBIN A1C: 5.4 % (ref 4.8–5.6)

## 2017-04-11 LAB — CBC WITH DIFFERENTIAL/PLATELET
BASOS ABS: 0 10*3/uL (ref 0.0–0.2)
Basos: 1 %
EOS (ABSOLUTE): 0.2 10*3/uL (ref 0.0–0.4)
EOS: 3 %
Hematocrit: 38.4 % (ref 34.0–46.6)
Hemoglobin: 12.4 g/dL (ref 11.1–15.9)
Immature Grans (Abs): 0 10*3/uL (ref 0.0–0.1)
Immature Granulocytes: 0 %
Lymphocytes Absolute: 1.9 10*3/uL (ref 0.7–3.1)
Lymphs: 30 %
MCH: 28.1 pg (ref 26.6–33.0)
MCHC: 32.3 g/dL (ref 31.5–35.7)
MCV: 87 fL (ref 79–97)
MONOS ABS: 0.6 10*3/uL (ref 0.1–0.9)
Monocytes: 9 %
NEUTROS PCT: 57 %
Neutrophils Absolute: 3.6 10*3/uL (ref 1.4–7.0)
PLATELETS: 232 10*3/uL (ref 150–379)
RBC: 4.41 x10E6/uL (ref 3.77–5.28)
RDW: 13.6 % (ref 12.3–15.4)
WBC: 6.2 10*3/uL (ref 3.4–10.8)

## 2017-04-11 LAB — COMPREHENSIVE METABOLIC PANEL
ALBUMIN: 5 g/dL (ref 3.5–5.5)
ALK PHOS: 75 IU/L (ref 39–117)
ALT: 21 IU/L (ref 0–32)
AST: 22 IU/L (ref 0–40)
Albumin/Globulin Ratio: 2.1 (ref 1.2–2.2)
BUN / CREAT RATIO: 10 (ref 9–23)
BUN: 7 mg/dL (ref 6–24)
Bilirubin Total: 0.5 mg/dL (ref 0.0–1.2)
CALCIUM: 9.8 mg/dL (ref 8.7–10.2)
CO2: 25 mmol/L (ref 20–29)
CREATININE: 0.69 mg/dL (ref 0.57–1.00)
Chloride: 100 mmol/L (ref 96–106)
GFR calc Af Amer: 111 mL/min/{1.73_m2} (ref >59)
GFR, EST NON AFRICAN AMERICAN: 96 mL/min/{1.73_m2} (ref >59)
GLOBULIN, TOTAL: 2.4 g/dL (ref 1.5–4.5)
GLUCOSE: 86 mg/dL (ref 65–99)
Potassium: 4 mmol/L (ref 3.5–5.2)
Sodium: 143 mmol/L (ref 134–144)
TOTAL PROTEIN: 7.4 g/dL (ref 6.0–8.5)

## 2017-04-11 LAB — LIPID PANEL
CHOLESTEROL TOTAL: 205 mg/dL — AB (ref 100–199)
Chol/HDL Ratio: 2.4 ratio (ref 0.0–4.4)
HDL: 86 mg/dL (ref >39)
LDL Calculated: 105 mg/dL — ABNORMAL HIGH (ref 0–99)
Triglycerides: 68 mg/dL (ref 0–149)
VLDL CHOLESTEROL CAL: 14 mg/dL (ref 5–40)

## 2017-04-11 LAB — TSH: TSH: 2.07 u[IU]/mL (ref 0.450–4.500)

## 2017-04-14 LAB — PAP IG AND HPV HIGH-RISK
HPV, HIGH-RISK: NEGATIVE
PAP Smear Comment: 0

## 2017-04-21 ENCOUNTER — Encounter: Payer: 59 | Admitting: Family Medicine

## 2017-06-09 ENCOUNTER — Encounter: Payer: Self-pay | Admitting: Family Medicine

## 2017-10-05 ENCOUNTER — Encounter: Payer: Self-pay | Admitting: Family Medicine

## 2017-10-07 ENCOUNTER — Encounter: Payer: Self-pay | Admitting: Family Medicine

## 2017-10-07 ENCOUNTER — Other Ambulatory Visit: Payer: Self-pay

## 2017-10-07 ENCOUNTER — Ambulatory Visit: Payer: PRIVATE HEALTH INSURANCE | Admitting: Family Medicine

## 2017-10-07 VITALS — BP 132/86 | HR 96 | Temp 98.7°F | Resp 16 | Ht 65.55 in | Wt 167.6 lb

## 2017-10-07 DIAGNOSIS — E7439 Other disorders of intestinal carbohydrate absorption: Secondary | ICD-10-CM

## 2017-10-07 DIAGNOSIS — E7800 Pure hypercholesterolemia, unspecified: Secondary | ICD-10-CM

## 2017-10-07 DIAGNOSIS — I471 Supraventricular tachycardia: Secondary | ICD-10-CM | POA: Diagnosis not present

## 2017-10-07 DIAGNOSIS — F329 Major depressive disorder, single episode, unspecified: Secondary | ICD-10-CM

## 2017-10-07 DIAGNOSIS — F32A Depression, unspecified: Secondary | ICD-10-CM

## 2017-10-07 DIAGNOSIS — E559 Vitamin D deficiency, unspecified: Secondary | ICD-10-CM

## 2017-10-07 DIAGNOSIS — M5030 Other cervical disc degeneration, unspecified cervical region: Secondary | ICD-10-CM

## 2017-10-07 DIAGNOSIS — F419 Anxiety disorder, unspecified: Secondary | ICD-10-CM

## 2017-10-07 LAB — COMPREHENSIVE METABOLIC PANEL
ALBUMIN: 4.8 g/dL (ref 3.5–5.5)
ALT: 20 IU/L (ref 0–32)
AST: 19 IU/L (ref 0–40)
Albumin/Globulin Ratio: 1.9 (ref 1.2–2.2)
Alkaline Phosphatase: 80 IU/L (ref 39–117)
BUN / CREAT RATIO: 13 (ref 9–23)
BUN: 9 mg/dL (ref 6–24)
Bilirubin Total: 0.4 mg/dL (ref 0.0–1.2)
CO2: 27 mmol/L (ref 20–29)
CREATININE: 0.71 mg/dL (ref 0.57–1.00)
Calcium: 10 mg/dL (ref 8.7–10.2)
Chloride: 99 mmol/L (ref 96–106)
GFR calc non Af Amer: 94 mL/min/{1.73_m2} (ref >59)
GFR, EST AFRICAN AMERICAN: 108 mL/min/{1.73_m2} (ref >59)
GLUCOSE: 95 mg/dL (ref 65–99)
Globulin, Total: 2.5 g/dL (ref 1.5–4.5)
Potassium: 4.8 mmol/L (ref 3.5–5.2)
Sodium: 141 mmol/L (ref 134–144)
TOTAL PROTEIN: 7.3 g/dL (ref 6.0–8.5)

## 2017-10-07 LAB — CBC WITH DIFFERENTIAL/PLATELET
BASOS ABS: 0 10*3/uL (ref 0.0–0.2)
BASOS: 1 %
EOS (ABSOLUTE): 0.3 10*3/uL (ref 0.0–0.4)
Eos: 4 %
HEMOGLOBIN: 12.8 g/dL (ref 11.1–15.9)
Hematocrit: 39.5 % (ref 34.0–46.6)
IMMATURE GRANS (ABS): 0 10*3/uL (ref 0.0–0.1)
Immature Granulocytes: 0 %
LYMPHS: 30 %
Lymphocytes Absolute: 1.9 10*3/uL (ref 0.7–3.1)
MCH: 28.4 pg (ref 26.6–33.0)
MCHC: 32.4 g/dL (ref 31.5–35.7)
MCV: 88 fL (ref 79–97)
MONOCYTES: 8 %
Monocytes Absolute: 0.5 10*3/uL (ref 0.1–0.9)
NEUTROS ABS: 3.5 10*3/uL (ref 1.4–7.0)
Neutrophils: 57 %
Platelets: 268 10*3/uL (ref 150–450)
RBC: 4.51 x10E6/uL (ref 3.77–5.28)
RDW: 13.7 % (ref 12.3–15.4)
WBC: 6.2 10*3/uL (ref 3.4–10.8)

## 2017-10-07 LAB — LIPID PANEL
CHOLESTEROL TOTAL: 210 mg/dL — AB (ref 100–199)
Chol/HDL Ratio: 2.4 ratio (ref 0.0–4.4)
HDL: 86 mg/dL (ref >39)
LDL Calculated: 114 mg/dL — ABNORMAL HIGH (ref 0–99)
Triglycerides: 50 mg/dL (ref 0–149)
VLDL Cholesterol Cal: 10 mg/dL (ref 5–40)

## 2017-10-07 MED ORDER — DULOXETINE HCL 30 MG PO CPEP: 30.0000 mg | ORAL_CAPSULE | Freq: Every day | ORAL | 1 refills | Status: DC

## 2017-10-07 MED ORDER — BUPROPION HCL ER (XL) 300 MG PO TB24: 300.0000 mg | ORAL_TABLET | Freq: Every day | ORAL | 1 refills | Status: DC

## 2017-10-07 NOTE — Patient Instructions (Addendum)
   IF you received an x-ray today, you will receive an invoice from [facility]. Please contact [facility] at [phone] with questions or concerns regarding your invoice.   IF you received labwork today, you will receive an invoice from [facility]. Please contact [facility] at [phone] with questions or concerns regarding your invoice.   Our billing staff will not be able to assist you with questions regarding bills from these companies.  You will be contacted with the lab results as soon as they are available. The fastest way to get your results is to activate your My Chart account. Instructions are located on the last page of this paperwork. If you have not heard from us regarding the results in 2 weeks, please contact this office.      Dyslipidemia Dyslipidemia is an imbalance of waxy, fat-like substances (lipids) in the blood. The body needs lipids in small amounts. Dyslipidemia often involves a high level of cholesterol or triglycerides, which are types of lipids. Common forms of dyslipidemia include:  High levels of bad cholesterol (LDL cholesterol). LDL is the type of cholesterol that causes fatty deposits (plaques) to build up in the blood vessels that carry blood away from your heart (arteries).  Low levels of good cholesterol (HDL cholesterol). HDL cholesterol is the type of cholesterol that protects against heart disease. High levels of HDL remove the LDL buildup from arteries.  High levels of triglycerides. Triglycerides are a fatty substance in the blood that is linked to a buildup of plaques in the arteries.  You can develop dyslipidemia because of the genes you are born with (primary dyslipidemia) or changes that occur during your life (secondary dyslipidemia), or as a side effect of certain medical treatments. What are the causes? Primary dyslipidemia is caused by changes (mutations) in genes that are passed down through families (inherited). These  mutations cause several types of dyslipidemia. Mutations can result in disorders that make the body produce too much LDL cholesterol or triglycerides, or not enough HDL cholesterol. These disorders may lead to heart disease, arterial disease, or stroke at an early age. Causes of secondary dyslipidemia include certain lifestyle choices and diseases that lead to dyslipidemia, such as:  Eating a diet that is high in animal fat.  Not getting enough activity or exercise (having a sedentary lifestyle).  Having diabetes, kidney disease, liver disease, or thyroid disease.  Drinking large amounts of alcohol.  Using certain types of drugs.  What increases the risk? You may be at greater risk for dyslipidemia if you are an older man or if you are a woman who has gone through menopause. Other risk factors include:  Having a family history of dyslipidemia.  Taking certain medicines, including birth control pills, steroids, some diuretics, beta-blockers, and some medicines forHIV.  Smoking cigarettes.  Eating a high-fat diet.  Drinking large amounts of alcohol.  Having certain medical conditions such as diabetes, polycystic ovary syndrome (PCOS), pregnancy, kidney disease, liver disease, or hypothyroidism.  Not exercising regularly.  Being overweight or obese with too much belly fat.  What are the signs or symptoms? Dyslipidemia does not usually cause any symptoms. Very high lipid levels can cause fatty bumps under the skin (xanthomas) or a white or gray ring around the black center (pupil) of the eye. Very high triglyceride levels can cause inflammation of the pancreas (pancreatitis). How is this diagnosed? Your health care provider may diagnose dyslipidemia based on a routine blood test (fasting blood test). Because most people do not   have symptoms of the condition, this blood testing (lipid profile) is done on adults age 20 and older and is repeated every 5 years. This test checks:  Total  cholesterol. This is a measure of the total amount of cholesterol in your blood, including LDL cholesterol, HDL cholesterol, and triglycerides. A healthy number is below 200.  LDL cholesterol. The target number for LDL cholesterol is different for each person, depending on individual risk factors. For most people, a number below 100 is healthy. Ask your health care provider what your LDL cholesterol number should be.  HDL cholesterol. An HDL level of 60 or higher is best because it helps to protect against heart disease. A number below 40 for men or below 50 for women increases the risk for heart disease.  Triglycerides. A healthy triglyceride number is below 150.  If your lipid profile is abnormal, your health care provider may do other blood tests to get more information about your condition. How is this treated? Treatment depends on the type of dyslipidemia that you have and your other risk factors for heart disease and stroke. Your health care provider will have a target range for your lipid levels based on this information. For many people, treatment starts with lifestyle changes, such as diet and exercise. Your health care provider may recommend that you:  Get regular exercise.  Make changes to your diet.  Quit smoking if you smoke.  If diet changes and exercise do not help you reach your goals, your health care provider may also prescribe medicine to lower lipids. The most commonly prescribed type of medicine lowers your LDL cholesterol (statin drug). If you have a high triglyceride level, your provider may prescribe another type of drug (fibrate) or an omega-3 fish oil supplement, or both. Follow these instructions at home:  Take over-the-counter and prescription medicines only as told by your health care provider. This includes supplements.  Get regular exercise. Start an aerobic exercise and strength training program as told by your health care provider. Ask your health care  provider what activities are safe for you. Your health care provider may recommend: ? 30 minutes of aerobic activity 4-6 days a week. Brisk walking is an example of aerobic activity. ? Strength training 2 days a week.  Eat a healthy diet as told by your health care provider. This can help you reach and maintain a healthy weight, lower your LDL cholesterol, and raise your HDL cholesterol. It may help to work with a diet and nutrition specialist (dietitian) to make a plan that is right for you. Your dietitian or health care provider may recommend: ? Limiting your calories, if you are overweight. ? Eating more fruits, vegetables, whole grains, fish, and lean meats. ? Limiting saturated fat, trans fat, and cholesterol.  Follow instructions from your health care provider or dietitian about eating or drinking restrictions.  Limit alcohol intake to no more than one drink per day for nonpregnant women and two drinks per day for men. One drink equals 12 oz of beer, 5 oz of wine, or 1 oz of hard liquor.  Do not use any products that contain nicotine or tobacco, such as cigarettes and e-cigarettes. If you need help quitting, ask your health care provider.  Keep all follow-up visits as told by your health care provider. This is important. Contact a health care provider if:  You are having trouble sticking to your exercise or diet plan.  You are struggling to quit smoking or control your use   of alcohol. Summary  Dyslipidemia is an imbalance of waxy, fat-like substances (lipids) in the blood. The body needs lipids in small amounts. Dyslipidemia often involves a high level of cholesterol or triglycerides, which are types of lipids.  Treatment depends on the type of dyslipidemia that you have and your other risk factors for heart disease and stroke.  For many people, treatment starts with lifestyle changes, such as diet and exercise. Your health care provider may also prescribe medicine to lower  lipids. This information is not intended to replace advice given to you by your health care provider. Make sure you discuss any questions you have with your health care provider. Document Released: 05/03/2013 Document Revised: 12/24/2015 Document Reviewed: 12/24/2015 Elsevier Interactive Patient Education  2018 [employer]  

## 2017-10-07 NOTE — Progress Notes (Signed)
Subjective:    Patient ID: Karina Sampson, female    DOB: [birth year], 57 y.o.   MRN: [id number]  10/07/2017  Chronic Conditions (6 month follow-up )    HPI This 57 y.o. female presents for six month follow-up of hypercholesterolemia, anxiety/depression.   Actively looking; wants to get closer to home.   Works with mostly seniors.  Youngest employee at work.   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 Also has nineteen. Also just hired one that is [age].   Trazodone made vision blurry; hypersomnolence. Counseling only as needed.  Wife and patient did marital counseling. No individual counseling in a while. Lots of counseling. Seeing [facility] in [place].   BP Readings from Last 3 Encounters:  10/07/17 132/86  04/10/17 138/82  10/14/16 (!) 142/85   Wt Readings from Last 3 Encounters:  10/07/17 167 lb 9.6 oz (76 kg)  04/10/17 165 lb (74.8 kg)  10/14/16 157 lb 6.4 oz (71.4 kg)   Immunization History  Administered Date(s) Administered  . Influenza Split 05/12/2009, 01/10/2013, 01/24/2014  . Influenza-Unspecified 02/10/2015, 02/13/2016, 02/24/2017  . Tdap 09/05/2010  . Zoster 04/04/2015    Review of Systems  Constitutional: Negative for activity change, appetite change, chills, diaphoresis, fatigue, fever and unexpected weight change.  HENT: Negative for congestion, dental problem, drooling, ear discharge, ear pain, facial swelling, hearing loss, mouth sores, nosebleeds, postnasal drip, rhinorrhea, sinus pressure, sneezing, sore throat, tinnitus, trouble swallowing and voice change.   Eyes: Negative for photophobia, pain, discharge, redness, itching and visual disturbance.  Respiratory: Negative for apnea, cough, choking, chest tightness, shortness of breath, wheezing and stridor.   Cardiovascular: Negative for chest pain, palpitations and leg swelling.  Gastrointestinal: Negative for abdominal distention, abdominal pain, anal bleeding, blood in stool,  constipation, diarrhea, nausea, rectal pain and vomiting.  Endocrine: Negative for cold intolerance, heat intolerance, polydipsia, polyphagia and polyuria.  Genitourinary: Negative for decreased urine volume, difficulty urinating, dyspareunia, dysuria, enuresis, flank pain, frequency, genital sores, hematuria, menstrual problem, pelvic pain, urgency, vaginal bleeding, vaginal discharge and vaginal pain.  Musculoskeletal: Negative for arthralgias, back pain, gait problem, joint swelling, myalgias, neck pain and neck stiffness.  Skin: Negative for color change, pallor, rash and wound.  Allergic/Immunologic: Negative for environmental allergies, food allergies and immunocompromised state.  Neurological: Negative for dizziness, tremors, seizures, syncope, facial asymmetry, speech difficulty, weakness, light-headedness, numbness and headaches.  Hematological: Negative for adenopathy. Does not bruise/bleed easily.  Psychiatric/Behavioral: Negative for agitation, behavioral problems, confusion, decreased concentration, dysphoric mood, hallucinations, self-injury, sleep disturbance and suicidal ideas. The patient is not nervous/anxious and is not hyperactive.     Past Medical History:  Diagnosis Date  . Allergic rhinitis, cause unspecified   . Allergy   . Anemia, unspecified   . Anxiety   . Arthritis   . Arthropathy, unspecified, site unspecified   . Breast nodule    Right  . Depression   . Headache(784.0)   . Heart murmur   . Hypertension   . Obesity, unspecified   . Other B-complex deficiencies   . Pure hypercholesterolemia   . SVT (supraventricular tachycardia) (HCC)   . Symptomatic menopausal or female climacteric states   . Tobacco use disorder   . Ulcer disease   . Unspecified disease of nail   . Unspecified vitamin D deficiency    Past Surgical History:  Procedure Laterality Date  . ELECTROPHYSIOLOGY STUDY  1997  . EYE SURGERY  2001   LASIK- BOTH  . svt surgery  with  ablation  . wisdom teeth extractions     Allergies  Allergen Reactions  . Penicillins Diarrhea and Nausea And Vomiting   Current Outpatient Medications on File Prior to Visit  Medication Sig Dispense Refill  . B Complex Vitamins (VITAMIN B COMPLEX PO) Take by mouth daily.    . celecoxib (CELEBREX) 200 MG capsule Take 200 mg by mouth daily.    . Cholecalciferol (VITAMIN D3 PO) Take 4,000 Units by mouth daily.    . fluticasone (FLONASE) 50 MCG/ACT nasal spray Place 2 sprays into both nostrils daily. 16 g 11  . Omega-3 Fatty Acids (FISH OIL CONCENTRATE) 1000 MG CAPS Take 1,000 mg by mouth daily.    . Omega-3 Fatty Acids (RA FISH OIL) 1000 MG CAPS Take by mouth.    . polycarbophil (FIBERCON) 625 MG tablet Take 625 mg by mouth daily.    . simvastatin (ZOCOR) 20 MG tablet Take 1 tablet (20 mg total) by mouth at bedtime. 90 tablet 3   No current facility-administered medications on file prior to visit.    Social History   Socioeconomic History  . Marital status: Married    Spouse name: Not on file  . Number of children: 0  . Years of education: postgradua  . Highest education level: Not on file  Occupational History  . Occupation: [employer]    Employer: [employer]    Comment: x15yrs  Social Needs  . Financial resource strain: Not on file  . Food insecurity:    Worry: Not on file    Inability: Not on file  . Transportation needs:    Medical: Not on file    Non-medical: Not on file  Tobacco Use  . Smoking status: Former Smoker    Packs/day: 1.00    Types: Cigarettes    Last attempt to quit: 05/13/1995    Years since quitting: 22.5  . Smokeless tobacco: Former Neurosurgeon  . Tobacco comment: quit 1997  Substance and Sexual Activity  . Alcohol use: Yes    Alcohol/week: 1.2 oz    Types: 2 Cans of beer per week    Comment: occasional once a month  . Drug use: No  . Sexual activity: Yes    Birth control/protection: None    Comment: number of sex parnters in the  last 12 months 1; same sex partners  Lifestyle  . Physical activity:    Days per week: Not on file    Minutes per session: Not on file  . Stress: Not on file  Relationships  . Social connections:    Talks on phone: Not on file    Gets together: Not on file    Attends religious service: Not on file    Active member of club or organization: Not on file    Attends meetings of clubs or organizations: Not on file    Relationship status: Not on file  . Intimate partner violence:    Fear of current or ex partner: Not on file    Emotionally abused: Not on file    Physically abused: Not on file    Forced sexual activity: Not on file  Other Topics Concern  . Not on file  Social History Narrative    Marital status:  Married May 2017.      Children: none; partner has 3 year old daughter.  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      Lives:  With wife, stepdaughter, 3 dogs.  Employment: [employer] x 14 years.  Moderately happy.        Tobacco: none      Alcohol: rare; none; wife in recovery.      Drugs: none      Exercise: playing pickleball.  Hiking.  2-3 days per week.  Walking.      Sexual activity: sexually active with same sexual partner.      Seatbelts: 100%; no texting   Family History  Problem Relation Age of Onset  . Dementia Mother   . Depression Mother   . Alzheimer's disease Mother   . Hypertension Mother   . COPD Mother   . Aortic aneurysm Father   . Hyperlipidemia Father   . Cancer Father        skin  . Stroke Father 81       retinal occlusion with visual loss central  . Dementia Father   . Parkinson's disease Brother   . Parkinsonism Brother   . Pulmonary fibrosis Brother   . Arthritis Brother        L hip replacement  . Bone cancer Unknown   . Breast cancer Neg Hx        Objective:    BP 132/86   Pulse 96   Temp 98.7 F (37.1 C) (Oral)   Resp 16   Ht 5' 5.55" (1.665 m)   Wt 167 lb 9.6 oz (76 kg)   SpO2 96%   BMI 27.42 kg/m  Physical  Exam  Constitutional: She is oriented to person, place, and time. She appears well-developed and well-nourished. No distress.  HENT:  Head: Normocephalic and atraumatic.  Right Ear: External ear normal.  Left Ear: External ear normal.  Nose: Nose normal.  Mouth/Throat: Oropharynx is clear and moist.  Eyes: Pupils are equal, round, and reactive to light. Conjunctivae and EOM are normal.  Neck: Normal range of motion. Neck supple. Carotid bruit is not present. No thyromegaly present.  Cardiovascular: Normal rate, regular rhythm, normal heart sounds and intact distal pulses. Exam reveals no gallop and no friction rub.  No murmur heard. Pulmonary/Chest: Effort normal and breath sounds normal. She has no wheezes. She has no rales.  Abdominal: Soft. Bowel sounds are normal. She exhibits no distension and no mass. There is no tenderness. There is no rebound and no guarding.  Lymphadenopathy:    She has no cervical adenopathy.  Neurological: She is alert and oriented to person, place, and time. No cranial nerve deficit.  Skin: Skin is warm and dry. No rash noted. She is not diaphoretic. No erythema. No pallor.  Psychiatric: She has a normal mood and affect. Her behavior is normal.   No results found. Depression screen [facility] 2/9 10/07/2017 04/10/2017 10/14/2016 04/15/2016 10/02/2015  Decreased Interest 0 0 0 0 0  Down, Depressed, Hopeless 0 0 0 0 0  PHQ - 2 Score 0 0 0 0 0   Fall Risk  10/07/2017 04/10/2017 10/14/2016 04/15/2016 10/02/2015  Falls in the past year? No No No No No  Number falls in past yr: - - - - -  Injury with Fall? - - - - -  Comment - - - - -        Assessment & Plan:   1. Anxiety and depression   2. Glucose intolerance   3. Pure hypercholesterolemia   4. SVT (supraventricular tachycardia) (HCC)   5. Vitamin D deficiency   6. Degenerative disc disease, cervical     Anxiety and  depression: stable; continue psychotherapy; rx for Cymbalta  daily and Wellbutrin XL   daily provided.  Encourage exercise for stress management.  Glucose intolerance and hypercholesterolemia: controlled with weight loss and exercise and dietary modification.  SVT: stable.   Vitamin D deficiency: stable; obtain labs.  Continue supplementation.   Orders Placed This Encounter  Procedures  . Comprehensive metabolic panel    Order Specific Question:   Has the patient fasted?    Answer:   No  . Lipid panel    Order Specific Question:   Has the patient fasted?    Answer:   No  . CBC with Differential/Platelet   Meds ordered this encounter  Medications  . buPROPion (WELLBUTRIN XL) 300 MG 24 hr tablet    Sig: Take 1 tablet (300 mg total) by mouth daily.    Dispense:  90 tablet    Refill:  1  . DULoxetine (CYMBALTA) 30 MG capsule    Sig: Take 1 capsule (30 mg total) by mouth daily. Return for follow up June 2018    Dispense:  90 capsule    Refill:  1    Return in about 6 months (around 04/09/2018) for complete physical examiniation.   Jakeob Tullis Paulita Fujita, M.D. Primary Care at [facility] previously [facility] [address] [place], [place]  [address] [phone] phone [phone] fax

## 2017-11-30 ENCOUNTER — Other Ambulatory Visit: Payer: Self-pay | Admitting: Family Medicine

## 2018-04-01 ENCOUNTER — Other Ambulatory Visit: Payer: Self-pay | Admitting: *Deleted

## 2018-04-01 DIAGNOSIS — E7800 Pure hypercholesterolemia, unspecified: Secondary | ICD-10-CM

## 2018-04-01 MED ORDER — BUPROPION HCL ER (XL) 300 MG PO TB24: 300.0000 mg | ORAL_TABLET | Freq: Every day | ORAL | 0 refills | Status: AC

## 2018-04-01 MED ORDER — SIMVASTATIN 20 MG PO TABS: 20.0000 mg | ORAL_TABLET | Freq: Every day | ORAL | 0 refills | Status: AC

## 2018-04-01 MED ORDER — DULOXETINE HCL 30 MG PO CPEP: 30.0000 mg | ORAL_CAPSULE | Freq: Every day | ORAL | 0 refills | Status: AC

## 2018-06-27 ENCOUNTER — Other Ambulatory Visit: Payer: Self-pay | Admitting: Family Medicine

## 2018-06-28 NOTE — Telephone Encounter (Signed)
Attempted to call patient and schedule an office viist for her refills. No answer, left message to call and schedule an appointment.  LOV  10/07/17 with Kevin Fenton.

## 2018-11-02 ENCOUNTER — Other Ambulatory Visit: Payer: Self-pay | Admitting: Family Medicine

## 2018-11-02 DIAGNOSIS — Z1231 Encounter for screening mammogram for malignant neoplasm of breast: Secondary | ICD-10-CM

## 2018-12-10 ENCOUNTER — Ambulatory Visit
Admission: RE | Admit: 2018-12-10 | Discharge: 2018-12-10 | Disposition: A | Discharge status: Home / Self Care | Payer: PRIVATE HEALTH INSURANCE | Source: Ambulatory Visit | Attending: Family Medicine | Admitting: Family Medicine

## 2018-12-10 ENCOUNTER — Other Ambulatory Visit: Payer: Self-pay

## 2018-12-10 DIAGNOSIS — Z1231 Encounter for screening mammogram for malignant neoplasm of breast: Secondary | ICD-10-CM | POA: Diagnosis present

## 2019-02-14 ENCOUNTER — Ambulatory Visit: Payer: PRIVATE HEALTH INSURANCE | Admitting: Physical Therapy

## 2019-02-21 ENCOUNTER — Other Ambulatory Visit: Payer: Self-pay

## 2019-02-21 ENCOUNTER — Ambulatory Visit: Payer: PRIVATE HEALTH INSURANCE | Attending: Gastroenterology | Admitting: Physical Therapy

## 2019-02-21 ENCOUNTER — Encounter: Payer: Self-pay | Admitting: Physical Therapy

## 2019-02-21 DIAGNOSIS — R293 Abnormal posture: Secondary | ICD-10-CM | POA: Diagnosis not present

## 2019-02-21 DIAGNOSIS — R278 Other lack of coordination: Secondary | ICD-10-CM | POA: Diagnosis present

## 2019-02-21 DIAGNOSIS — M6281 Muscle weakness (generalized): Secondary | ICD-10-CM

## 2019-02-21 NOTE — Therapy (Signed)
[facility] [address] [place], [place], [address] Phone: [phone]   Fax:  [phone]  Physical Therapy Evaluation  Patient Details  Name: Karina Sampson MRN: [id number] Date of Birth: Nov 16, 1960 Referring Provider (PT): Ledon Snare   Encounter Date: 02/21/2019  PT End of Session - 02/21/19 1852    Visit Number  1    Number of Visits  13    Date for PT Re-Evaluation  05/16/19    Authorization Time Period  IE 02/21/2019    PT Start Time  1700    PT Stop Time  1758    PT Time Calculation (min)  58 min    Activity Tolerance  Patient tolerated treatment well    Behavior During Therapy  [facility] for tasks assessed/performed       Past Medical History:  Diagnosis Date  . Allergic rhinitis, cause unspecified   . Allergy   . Anemia, unspecified   . Anxiety   . Arthritis   . Arthropathy, unspecified, site unspecified   . Breast nodule    Right  . Depression   . Headache(784.0)   . Heart murmur   . Hypertension   . Obesity, unspecified   . Other B-complex deficiencies   . Pure hypercholesterolemia   . SVT (supraventricular tachycardia) (HCC)   . Symptomatic menopausal or female climacteric states   . Tobacco use disorder   . Ulcer disease   . Unspecified disease of nail   . Unspecified vitamin D deficiency     Past Surgical History:  Procedure Laterality Date  . ELECTROPHYSIOLOGY STUDY  1997  . EYE SURGERY  2001   LASIK- BOTH  . svt surgery     with ablation  . wisdom teeth extractions      There were no vitals filed for this visit.  Pelvic Floor Physical Therapy Evaluation and Assessment  SCREENING  Falls in last 6 mo: None reported  Red Flags: None reported. Have you had any night sweats? Unexplained weight loss? Saddle anesthesia? Unexplained changes in bowel or bladder habits?  SUBJECTIVE  Patient reports: Patient went to MD for side pain (R); patient was referred to GI. Patient has fecal  incontinence which has been going on for many years. Patient reports that she has fecal incontinence where she does not sense the incontinence and frequently it is Type 6/7 [place] Stool Chart. Patient notes that she has back pain that wraps around the side and comes down the front and down into the quad; started February 2020. Pain does not travel past the knee. Patient presently has an anal fissure. Patient has BM more regularly now 2/2 to stool softener, but has gone > 3 days without a BM and cannot specify a pattern. She notes that her diet pre-covid never had any impact on her fecal incontinence.  Precautions:  None  Recent Procedures/Tests/Findings:  R hip imaging; (-) for arthritic changes, (+) full colon  Gynecological History: Bowel movements? Type 1/7. Some straining. Diet? Fast food currently; (pre-covid: salad/sandwich). Vegetarian  Urinary History: + SUI, + Urgency. Leakage triggers?  Coughing, laughing sneezing; key in the door. Standing with a full bladder. Bladder habits? 20-30 min after drinking water  Gastrointestinal History: Bowel movements? Type 1/7. Some straining. Diet? Fast food currently; (pre-covid: salad/sandwich). Vegetarian  Sexual activity/pain: (+) pain with penetration and pain with gynecological exam  Location of pain: R side body to R thigh Current pain:  4/10  Max pain:  9/10 Least pain:  0/10 Nature of pain: similar to side cramp that you get when exercising, but does not go away  Patient Goals: Not having FI at all and/or being able to predict when it is coming   OBJECTIVE  Posture/Observations:  Sitting: Slumped Standing: frequent supination rocking  Palpation/Segmental Motion/Joint Play: Generally hypomobile throughout spine  Special tests:   + left ext-rotation + SLS, B for Trendelenburg   Range of Motion/Flexibilty:  Spine: B lumbar lateral flexion limited, + LLF for concordant pain Hips: R hip limited and painful in AROM hip  flexion    Strength/MMT:  LE MMT  LE MMT Left Right  Hip flex:  (L2) 5/5 3+/5  Hip ext: 4/5 3+/5  Hip abd: 4/5 4/5  Hip add: 4/5 4/5  Knee flex: 5/5 3+/5  Knee ext: 5/5 4/5     Abdominal: deferred 2/2 to time and patient comfort Palpation: Diastasis:  Pelvic Floor External Exam: deferred 2/2 to time and patient comfort Introitus Appears:  Skin integrity:  Palpation: Cough: Prolapse visible?: Scar mobility:  Internal Vaginal Exam: deferred 2/2 to time and patient comfort Strength (PERF):  Symmetry: Palpation: Prolapse:   Internal Rectal Exam: deferred 2/2 to time and patient comfort Strength (PERF): Symmetry: Palpation: Prolapse:   Gait Analysis: Increased B ER, B mild supination at IC, no gross abnormalities  Pelvic Floor Outcome Measures:  FISI (42/61); PFIQ 7: 86 (CRAIQ 7: 48, UIQ 7: 68)  ASSESSMENT Patient is a 58 year old presenting to clinic with chief complaints of fecal and bladder incontinence with R sided back pain. Upon examination, patient demonstrates deficits in lumbar spine mobility, RLE strength, PFM coordination, strength, and extensibility as evidenced by (+) L extension-rotation, grossly 4/5 RLE MMT, + SUI and urinary urgency, and presence of pelvic pain with penetration force. Patient's responses on FISI and PFIQ-7 outcome measures (FISI (42/61); PFIQ 7: 86 (CRAIQ 7: 48, UIQ 7: 38)) indicate significant functional limitations/disability/distress. Patient's progress may be limited due to chronicity of the issue and presence of comorbidities including but not limited to anxiety; however, patient's motivation is advantageous. Patient was able to achieve basic understanding PFM function and toileting posture during today's evaluation and responded positively to educational interventions. Patient will benefit from continued skilled therapeutic intervention to address deficits in lumbar spine mobility, RLE strength, PFM coordination, strength, and  extensibility in order to decrease incidence of incontinence, increase function, and improve overall QOL.   Objective measurements completed on examination: See above findings.   TREATMENT  Neuromuscular Re-education: Patient educated on primary functions of the pelvic floor including: posture/balance, sexual pleasure, storage and elimination of waste from the body, abdominal cavity closure, and breath coordination. Patient educated on toileting posture, bowel retraining, colonic massage, and typical bowel function.  Patient educated on prognosis, POC, and provided with HEP including: bowel/bladder diary, sleep hygiene, colon massage. Patient articulated understanding and returned demonstration. Patient will benefit from further education in order to maximize compliance and understanding for long-term therapeutic gains.              PT Long Term Goals - 02/21/19 1849      PT LONG TERM GOAL #1   Title  Patient will report BMs classified as Type 3-Type 4 on the [facility] Stool Chart greater than 50% of the time to demonstrate improved motility and stool bulking in order to decrease fecal distress and improve overall QOL.    Baseline  IE: Type 1/ Type 7 100%    Time  12  Period  Weeks    Status  New    Target Date  05/16/19      PT LONG TERM GOAL #2   Title  Patient will report >2 weeks without episode of fecal incontinence/smearing for improved function and participation at home and in the community.    Baseline  IE: 2+ times/ week    Time  12    Period  Weeks    Status  New    Target Date  05/16/19      PT LONG TERM GOAL #3   Title  Patient will report increased bowel movement consistency from worst (Type 1) 2 x / week to (Type 3/4) 4 x/ week to demonstrate improved bowel and PFM function for decrease limitations of participation at home and in the community.    Baseline  IE: Type 1 2 x/week    Time  12    Period  Weeks    Status  New    Target Date  05/16/19      PT  LONG TERM GOAL #4   Title  Patient will indicate at least a 36 point difference on the PFIQ-7 short form to demonstrate clinically significant improvement for a restoration/improvement in function at home and in the community.    Baseline  IE: 86    Time  12    Period  Weeks    Status  New    Target Date  05/16/19      PT LONG TERM GOAL #5   Title  Patient will indicate >/= a 3.56 point decrease or a score below 30 on the FISI to demonstrate clinically significant improvement in fecal incontinence for improved participation and overall QOL.    Baseline  IE: 42    Time  12    Period  Weeks    Status  New    Target Date  05/16/19             Plan - 02/21/19 1843    Clinical Impression Statement  Patient is a 58 year old presenting to clinic with chief complaints of fecal and bladder incontinence with R sided back pain. Upon examination, patient demonstrates deficits in lumbar spine mobility, RLE strength, PFM coordination, strength, and extensibility as evidenced by (+) L extension-rotation, grossly 4/5 RLE MMT, + SUI and urinary urgency, and presence of pelvic pain with penetration force. Patient's responses on FISI and PFIQ-7 outcome measures (FISI (42/61); PFIQ 7: 86 (CRAIQ 7: 48, UIQ 7: 38)) indicate significant functional limitations/disability/distress. Patient's progress may be limited due to chronicity of the issue and presence of comorbidities including but not limited to anxiety; however, patient's motivation is advantageous. Patient was able to achieve basic understanding PFM function and toileting posture during today's evaluation and responded positively to educational interventions. Patient will benefit from continued skilled therapeutic intervention to address deficits in lumbar spine mobility, RLE strength, PFM coordination, strength, and extensibility in order to decrease incidence of incontinence, increase function, and improve overall QOL.    Personal Factors and  Comorbidities  Age;Fitness;Past/Current Experience;Behavior Pattern;Comorbidity 3+;Time since onset of injury/illness/exacerbation    Comorbidities  HTN, anxiety, arthritis, depression    Examination-Activity Limitations  Transfers;Continence;Lift;Hygiene/Grooming;Toileting;Squat;Bend    Examination-Participation Restrictions  Cleaning;Yard Work;Laundry;Driving;Shop;Interpersonal Relationship    Stability/Clinical Decision Making  Evolving/Moderate complexity    Clinical Decision Making  Moderate    Rehab Potential  Fair    PT Frequency  1x / week    PT Duration  12 weeks  PT Treatment/Interventions  ADLs/Self Care Home Management;Biofeedback;Electrical Stimulation;Moist Heat;Cryotherapy;Balance training;Therapeutic exercise;Therapeutic activities;Neuromuscular re-education;Patient/family education;Dry needling;Manual techniques;Splinting;Taping;Spinal Manipulations;Joint Manipulations    PT Next Visit Plan  external PFM assessment as tolerated; IAP basics    Consulted and Agree with Plan of Care  Patient       Patient will benefit from skilled therapeutic intervention in order to improve the following deficits and impairments:  Decreased coordination, Increased fascial restricitons, Obesity, Pain, Increased muscle spasms, Decreased endurance, Decreased balance, Decreased strength, Postural dysfunction, Improper body mechanics, Impaired flexibility  Visit Diagnosis: Abnormal posture  Other lack of coordination  Muscle weakness (generalized)     Problem List Patient Active Problem List   Diagnosis Date Noted  . SVT (supraventricular tachycardia) (HCC) 04/10/2017  . Degenerative disc disease, cervical 04/10/2017  . Overweight (BMI 25.0-29.9) 09/25/2014  . Pure hypercholesterolemia 05/31/2012  . Other abnormal glucose 05/31/2012  . Vitamin D deficiency 05/31/2012  . Anxiety and depression 05/31/2012  . Blood pressure elevated 05/31/2012    Kathryne ErikssonKatlin E Julian Medina 02/21/2019, 6:54  PM  [facility] [address] [place], [place], [address] Phone: [phone]   Fax:  [phone]  Name: Murrell ReddenVicky L Sampson MRN: [id number] Date of Birth: 10/01/1960

## 2019-02-28 ENCOUNTER — Encounter: Payer: Self-pay | Admitting: Physical Therapy

## 2019-02-28 ENCOUNTER — Ambulatory Visit: Payer: PRIVATE HEALTH INSURANCE | Admitting: Physical Therapy

## 2019-02-28 ENCOUNTER — Other Ambulatory Visit: Payer: Self-pay

## 2019-02-28 DIAGNOSIS — R293 Abnormal posture: Secondary | ICD-10-CM | POA: Diagnosis not present

## 2019-02-28 DIAGNOSIS — R278 Other lack of coordination: Secondary | ICD-10-CM

## 2019-02-28 DIAGNOSIS — M6281 Muscle weakness (generalized): Secondary | ICD-10-CM

## 2019-02-28 NOTE — Therapy (Signed)
[place] [facility] [address]. [place], [place], [address] Phone: [phone]   Fax:  [phone]  Physical Therapy Treatment  Patient Details  Name: Karina Sampson MRN: [id number] Date of Birth: 1961/04/04 Referring Provider (PT): Ellis Savage   Encounter Date: 02/28/2019  PT End of Session - 02/28/19 1645    Visit Number  2    Number of Visits  13    Date for PT Re-Evaluation  05/16/19    Authorization Time Period  IE 02/21/2019    PT Start Time  1650    PT Stop Time  1745    PT Time Calculation (min)  55 min    Activity Tolerance  Patient tolerated treatment well    Behavior During Therapy  [facility] for tasks assessed/performed       Past Medical History:  Diagnosis Date  . Allergic rhinitis, cause unspecified   . Allergy   . Anemia, unspecified   . Anxiety   . Arthritis   . Arthropathy, unspecified, site unspecified   . Breast nodule    Right  . Depression   . Headache(784.0)   . Heart murmur   . Hypertension   . Obesity, unspecified   . Other B-complex deficiencies   . Pure hypercholesterolemia   . SVT (supraventricular tachycardia) ([place])   . Symptomatic menopausal or female climacteric states   . Tobacco use disorder   . Ulcer disease   . Unspecified disease of nail   . Unspecified vitamin D deficiency     Past Surgical History:  Procedure Laterality Date  . ELECTROPHYSIOLOGY STUDY  1997  . EYE SURGERY  2001   LASIK- BOTH  . svt surgery     with ablation  . wisdom teeth extractions      There were no vitals filed for this visit. TREATMENT  Pelvic Floor External Exam: Patient verbally consented to the external exam. Breath coordination: negligible movement of PFM with breath Palpation: no TTP reported Cough: incontinent of flatus, paradoxical Cued lengthen: unable to coordinate Cued contraction: delayed, minimal lift with no apparent squeeze from external palpation   Neuromuscular  Re-education: Patient education on intra-abdominal pressure system and impacts of Valsalva maneuver on PFM and bowel and bladder emptying. Additionally, patient educated on breathing mechanics in relationship to nervous sytem down-training/responses. Supine hooklying diaphragmatic breathing with VCs and TCs for downregulation of the nervous system and improved management of IAP Supine hooklying, PFM lengthening with inhalation. VCs and TCs to decrease compensatory patterns and encourage optimal relaxation of the PFM. Sidelying diaphragmatic breathing for improved diaphragmatic and rib cage excursion. VCs and TCs to prevent compensations. Sidelying, PFM lengthening with inhalation. VCs and TCs to decrease compensatory patterns and encourage optimal relaxation of the PFM.    Patient educated throughout session on appropriate technique and form using multi-modal cueing, HEP, and activity modification. Patient articulated understanding and returned demonstration.  Patient Response to interventions: Patient reporting confidence with HEP of breathing.  ASSESSMENT Patient presents to clinic with excellent motivation to participate in therapy. Patient demonstrates deficits in lumbar spine mobility, RLE strength, PFM coordination, strength, and extensibility. Patient able to achieve diaphragmatic breathing during today's session and responded positively to educational interventions. Patient will benefit from continued skilled therapeutic intervention to address remaining deficits in lumbar spine mobility, RLE strength, PFM coordination, strength, and extensibility in order to decreased incidence on incontinence, increase function, and improve overall QOL.    PT Long Term Goals - 02/21/19 1849  PT LONG TERM GOAL #1   Title  Patient will report BMs classified as Type 3-Type 4 on the [facility] Stool Chart greater than 50% of the time to demonstrate improved motility and stool bulking in order to decrease  fecal distress and improve overall QOL.    Baseline  IE: Type 1/ Type 7 100%    Time  12    Period  Weeks    Status  New    Target Date  05/16/19      PT LONG TERM GOAL #2   Title  Patient will report >2 weeks without episode of fecal incontinence/smearing for improved function and participation at home and in the community.    Baseline  IE: 2+ times/ week    Time  12    Period  Weeks    Status  New    Target Date  05/16/19      PT LONG TERM GOAL #3   Title  Patient will report increased bowel movement consistency from worst (Type 1) 2 x / week to (Type 3/4) 4 x/ week to demonstrate improved bowel and PFM function for decrease limitations of participation at home and in the community.    Baseline  IE: Type 1 2 x/week    Time  12    Period  Weeks    Status  New    Target Date  05/16/19      PT LONG TERM GOAL #4   Title  Patient will indicate at least a 36 point difference on the PFIQ-7 short form to demonstrate clinically significant improvement for a restoration/improvement in function at home and in the community.    Baseline  IE: 86    Time  12    Period  Weeks    Status  New    Target Date  05/16/19      PT LONG TERM GOAL #5   Title  Patient will indicate >/= a 3.56 point decrease or a score below 30 on the FISI to demonstrate clinically significant improvement in fecal incontinence for improved participation and overall QOL.    Baseline  IE: 42    Time  12    Period  Weeks    Status  New    Target Date  05/16/19            Plan - 02/28/19 1829    Clinical Impression Statement  Patient presents to clinic with excellent motivation to participate in therapy. Patient demonstrates deficits in lumbar spine mobility, RLE strength, PFM coordination, strength, and extensibility. Patient able to achieve diaphragmatic breathing during today's session and responded positively to educational interventions. Patient will benefit from continued skilled therapeutic intervention to  address remaining deficits in lumbar spine mobility, RLE strength, PFM coordination, strength, and extensibility in order to decreased incidence on incontinence, increase function, and improve overall QOL.    Personal Factors and Comorbidities  Age;Fitness;Past/Current Experience;Behavior Pattern;Comorbidity 3+;Time since onset of injury/illness/exacerbation    Comorbidities  HTN, anxiety, arthritis, depression    Examination-Activity Limitations  Transfers;Continence;Lift;Hygiene/Grooming;Toileting;Squat;Bend    Examination-Participation Restrictions  Cleaning;Yard Work;Laundry;Driving;Shop;Interpersonal Relationship    Stability/Clinical Decision Making  Evolving/Moderate complexity    Rehab Potential  Fair    PT Frequency  1x / week    PT Duration  12 weeks    PT Treatment/Interventions  ADLs/Self Care Home Management;Biofeedback;Electrical Stimulation;Moist Heat;Cryotherapy;Balance training;Therapeutic exercise;Therapeutic activities;Neuromuscular re-education;Patient/family education;Dry needling;Manual techniques;Splinting;Taping;Spinal Manipulations;Joint Manipulations    PT Next Visit Plan  IAP basics    PT  Home Exercise Plan  diaphragmatic breathing, PFM lengthening    Consulted and Agree with Plan of Care  Patient       Patient will benefit from skilled therapeutic intervention in order to improve the following deficits and impairments:  Decreased coordination, Increased fascial restricitons, Obesity, Pain, Increased muscle spasms, Decreased endurance, Decreased balance, Decreased strength, Postural dysfunction, Improper body mechanics, Impaired flexibility  Visit Diagnosis: Abnormal posture  Other lack of coordination  Muscle weakness (generalized)     Problem List Patient Active Problem List   Diagnosis Date Noted  . SVT (supraventricular tachycardia) (HCC) 04/10/2017  . Degenerative disc disease, cervical 04/10/2017  . Overweight (BMI 25.0-29.9) 09/25/2014  . Pure  hypercholesterolemia 05/31/2012  . Other abnormal glucose 05/31/2012  . Vitamin D deficiency 05/31/2012  . Anxiety and depression 05/31/2012  . Blood pressure elevated 05/31/2012   Sheria LangKatlin Harker PT, DPT [phone]#[id number] 02/28/2019, 6:38 PM  [place] [facility] [address] [place], [place], [address] Phone: [phone]   Fax:  [phone]  Name: Karina Sampson MRN: [id number] Date of Birth: 10/08/1960

## 2019-03-07 ENCOUNTER — Ambulatory Visit: Payer: PRIVATE HEALTH INSURANCE | Admitting: Physical Therapy

## 2019-03-07 ENCOUNTER — Other Ambulatory Visit: Payer: Self-pay

## 2019-03-07 ENCOUNTER — Encounter: Payer: Self-pay | Admitting: Physical Therapy

## 2019-03-07 DIAGNOSIS — R293 Abnormal posture: Secondary | ICD-10-CM | POA: Diagnosis not present

## 2019-03-07 DIAGNOSIS — M6281 Muscle weakness (generalized): Secondary | ICD-10-CM

## 2019-03-07 DIAGNOSIS — R278 Other lack of coordination: Secondary | ICD-10-CM

## 2019-03-07 NOTE — Therapy (Signed)
[place] [facility] [address] [place], [place], [address] Phone: [phone]   Fax:  [phone]  Physical Therapy Treatment  Patient Details  Name: Karina Sampson MRN: [id number] Date of Birth: 05-26-1960 Referring Provider (PT): Ellis Savage   Encounter Date: 03/07/2019  PT End of Session - 03/07/19 1713    Visit Number  3    Number of Visits  13    Date for PT Re-Evaluation  05/16/19    Authorization Time Period  IE 02/21/2019    PT Start Time  1701    PT Stop Time  1758    PT Time Calculation (min)  57 min    Activity Tolerance  Patient tolerated treatment well    Behavior During Therapy  [facility] for tasks assessed/performed       Past Medical History:  Diagnosis Date  . Allergic rhinitis, cause unspecified   . Allergy   . Anemia, unspecified   . Anxiety   . Arthritis   . Arthropathy, unspecified, site unspecified   . Breast nodule    Right  . Depression   . Headache(784.0)   . Heart murmur   . Hypertension   . Obesity, unspecified   . Other B-complex deficiencies   . Pure hypercholesterolemia   . SVT (supraventricular tachycardia) ([place])   . Symptomatic menopausal or female climacteric states   . Tobacco use disorder   . Ulcer disease   . Unspecified disease of nail   . Unspecified vitamin D deficiency     Past Surgical History:  Procedure Laterality Date  . ELECTROPHYSIOLOGY STUDY  1997  . EYE SURGERY  2001   LASIK- BOTH  . svt surgery     with ablation  . wisdom teeth extractions      There were no vitals filed for this visit.  Subjective Assessment - 03/07/19 1708    Subjective  Patient notes that she is feeling fatigued and did not sleep well. She has had her temperature taken twice and is afebrile. She notes that she continues to have R side pain; and notes that this weekend it wasn't as overwhelming. Today she was up and down a lot and it has been bothering her more.    Currently in Pain?  Yes    Pain Score  5     Pain Location  Abdomen    Pain Orientation  Right;Lower    Pain Descriptors / Indicators  Nagging       TREATMENT  Manual Therapy: STM and TPR performed to R psoas, iliacus, and external obliques to allow for decreased tension and pain and improved posture and function  Neuromuscular Re-education: Patient education on sleep hygiene, pain coping skills, and stress management on inflammatory responses in the body. Supine hooklying diaphragmatic breathing with VCs and TCs for downregulation of the nervous system and improved management of IAP Supine hooklying, PFM lengthening with inhalation. VCs and TCs to decrease compensatory patterns and encourage optimal relaxation of the PFM. Supine R hip flexor stretch (modified Thomas Stretch), 2x 3 min with VCs and TCs to improve muscle elongation for improved posture and decreased RLQ pain.    Patient educated throughout session on appropriate technique and form using multi-modal cueing, HEP, and activity modification. Patient articulated understanding and returned demonstration.  Patient Response to interventions: Patient reporting absence of headache.  ASSESSMENT Patient presents to clinic with excellent motivation to participate in therapy. Patient demonstrates deficits in lumbar spine mobility, RLE strength, PFM coordination,  strength, and extensibility. Patient consistently able to use diaphragmatic breathing for IAP management and pain modulation during today's session and responded positively to manual interventions. Patient will benefit from continued skilled therapeutic intervention to address remaining deficits in lumbar spine mobility, RLE strength, PFM coordination, strength, and extensibility in order to decreased incidence on incontinence, increase function, and improve overall QOL.   PT Long Term Goals - 02/21/19 1849      PT LONG TERM GOAL #1   Title  Patient will report BMs classified as Type 3-Type 4 on the  [facility] Stool Chart greater than 50% of the time to demonstrate improved motility and stool bulking in order to decrease fecal distress and improve overall QOL.    Baseline  IE: Type 1/ Type 7 100%    Time  12    Period  Weeks    Status  New    Target Date  05/16/19      PT LONG TERM GOAL #2   Title  Patient will report >2 weeks without episode of fecal incontinence/smearing for improved function and participation at home and in the community.    Baseline  IE: 2+ times/ week    Time  12    Period  Weeks    Status  New    Target Date  05/16/19      PT LONG TERM GOAL #3   Title  Patient will report increased bowel movement consistency from worst (Type 1) 2 x / week to (Type 3/4) 4 x/ week to demonstrate improved bowel and PFM function for decrease limitations of participation at home and in the community.    Baseline  IE: Type 1 2 x/week    Time  12    Period  Weeks    Status  New    Target Date  05/16/19      PT LONG TERM GOAL #4   Title  Patient will indicate at least a 36 point difference on the PFIQ-7 short form to demonstrate clinically significant improvement for a restoration/improvement in function at home and in the community.    Baseline  IE: 86    Time  12    Period  Weeks    Status  New    Target Date  05/16/19      PT LONG TERM GOAL #5   Title  Patient will indicate >/= a 3.56 point decrease or a score below 30 on the FISI to demonstrate clinically significant improvement in fecal incontinence for improved participation and overall QOL.    Baseline  IE: 42    Time  12    Period  Weeks    Status  New    Target Date  05/16/19            Plan - 03/07/19 1713    Clinical Impression Statement  Patient presents to clinic with excellent motivation to participate in therapy. Patient demonstrates deficits in lumbar spine mobility, RLE strength, PFM coordination, strength, and extensibility. Patient consistently able to use diaphragmatic breathing for IAP management  and pain modulation during today's session and responded positively to manual interventions. Patient will benefit from continued skilled therapeutic intervention to address remaining deficits in lumbar spine mobility, RLE strength, PFM coordination, strength, and extensibility in order to decreased incidence on incontinence, increase function, and improve overall QOL.    Personal Factors and Comorbidities  Age;Fitness;Past/Current Experience;Behavior Pattern;Comorbidity 3+;Time since onset of injury/illness/exacerbation    Comorbidities  HTN, anxiety, arthritis, depression  Examination-Activity Limitations  Transfers;Continence;Lift;Hygiene/Grooming;Toileting;Squat;Bend    Examination-Participation Restrictions  Cleaning;Yard Work;Laundry;Driving;Shop;Interpersonal Relationship    Stability/Clinical Decision Making  Evolving/Moderate complexity    Rehab Potential  Fair    PT Frequency  1x / week    PT Duration  12 weeks    PT Treatment/Interventions  ADLs/Self Care Home Management;Biofeedback;Electrical Stimulation;Moist Heat;Cryotherapy;Balance training;Therapeutic exercise;Therapeutic activities;Neuromuscular re-education;Patient/family education;Dry needling;Manual techniques;Splinting;Taping;Spinal Manipulations;Joint Manipulations    PT Next Visit Plan  IAP basics    PT Home Exercise Plan  diaphragmatic breathing, PFM lengthening    Consulted and Agree with Plan of Care  Patient       Patient will benefit from skilled therapeutic intervention in order to improve the following deficits and impairments:  Decreased coordination, Increased fascial restricitons, Obesity, Pain, Increased muscle spasms, Decreased endurance, Decreased balance, Decreased strength, Postural dysfunction, Improper body mechanics, Impaired flexibility  Visit Diagnosis: Abnormal posture  Other lack of coordination  Muscle weakness (generalized)     Problem List Patient Active Problem List   Diagnosis Date  Noted  . SVT (supraventricular tachycardia) (HCC) 04/10/2017  . Degenerative disc disease, cervical 04/10/2017  . Overweight (BMI 25.0-29.9) 09/25/2014  . Pure hypercholesterolemia 05/31/2012  . Other abnormal glucose 05/31/2012  . Vitamin D deficiency 05/31/2012  . Anxiety and depression 05/31/2012  . Blood pressure elevated 05/31/2012   Sheria Lang PT, DPT [phone] 03/07/2019, 6:27 PM  [place] [facility] [address] [place], [place], [address] Phone: [phone]   Fax:  [phone]  Name: Karina Sampson MRN: [id number] Date of Birth: [birth year]

## 2019-03-14 ENCOUNTER — Encounter: Payer: PRIVATE HEALTH INSURANCE | Admitting: Physical Therapy

## 2019-03-28 ENCOUNTER — Encounter: Payer: Self-pay | Admitting: Physical Therapy

## 2019-03-28 ENCOUNTER — Ambulatory Visit: Payer: PRIVATE HEALTH INSURANCE | Attending: Gastroenterology | Admitting: Physical Therapy

## 2019-03-28 ENCOUNTER — Other Ambulatory Visit: Payer: Self-pay

## 2019-03-28 DIAGNOSIS — R293 Abnormal posture: Secondary | ICD-10-CM | POA: Diagnosis not present

## 2019-03-28 DIAGNOSIS — M6281 Muscle weakness (generalized): Secondary | ICD-10-CM | POA: Diagnosis present

## 2019-03-28 DIAGNOSIS — R278 Other lack of coordination: Secondary | ICD-10-CM | POA: Diagnosis present

## 2019-03-28 NOTE — Therapy (Signed)
[facility] [address] [place], [place], [address] Phone: [phone]   Fax:  [phone]  Physical Therapy Treatment  Patient Details  Name: Karina ReddenVicky L Wingate MRN: [id number] Date of Birth: 11/06/1960 Referring Provider (PT): Ledon SnareWoodward, Janice   Encounter Date: 03/28/2019  PT End of Session - 03/28/19 1652    Visit Number  4    Number of Visits  13    Date for PT Re-Evaluation  05/16/19    Authorization Time Period  IE 02/21/2019    PT Start Time  1652    PT Stop Time  1757    PT Time Calculation (min)  65 min    Activity Tolerance  Patient tolerated treatment well    Behavior During Therapy  [facility] for tasks assessed/performed       Past Medical History:  Diagnosis Date  . Allergic rhinitis, cause unspecified   . Allergy   . Anemia, unspecified   . Anxiety   . Arthritis   . Arthropathy, unspecified, site unspecified   . Breast nodule    Right  . Depression   . Headache(784.0)   . Heart murmur   . Hypertension   . Obesity, unspecified   . Other B-complex deficiencies   . Pure hypercholesterolemia   . SVT (supraventricular tachycardia) (HCC)   . Symptomatic menopausal or female climacteric states   . Tobacco use disorder   . Ulcer disease   . Unspecified disease of nail   . Unspecified vitamin D deficiency     Past Surgical History:  Procedure Laterality Date  . ELECTROPHYSIOLOGY STUDY  1997  . EYE SURGERY  2001   LASIK- BOTH  . svt surgery     with ablation  . wisdom teeth extractions      There were no vitals filed for this visit.  Subjective Assessment - 03/28/19 1656    Subjective  Patient reports that she was feeling very pleased with her progress but notes that yesterday she had a setback. She attributes this to taking Colace at a different time. Patient reports that she will be traveling for Thanksgiving. Patient hasn't had pain in her side until today. 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    Currently in Pain?  Yes    Pain Score  4     Pain Location  Abdomen    Pain Orientation  Right;Lower    Pain Descriptors / Indicators  Sore       TREATMENT  Neuromuscular Re-education: Patient education on intra-abdominal pressure system and use of breath as a management of pressure for decreased strain on the PFM. Patient educated extensively on SNS versus PNS breathing patterns and their impact on diaphragm and PFM coordination with carryover to mismanagement of IAP.  Sidelying diaphragmatic breathing with VCs and TCs for downregulation of the nervous system and improved management of IAP Sidelying, PFM lengthening with inhalation. VCs and TCs to decrease compensatory patterns and encourage optimal relaxation of the PFM. Sidelying, thoracolumbar rotations (open-book) bilaterally with diaphragmatic breathing for improved diaphragmatic and rib cage excursion. VCs and TCs to prevent compensations.   Patient educated throughout session on appropriate technique and form using multi-modal cueing, HEP, and activity modification. Patient articulated understanding and returned demonstration.  Patient Response to interventions: Patient reporting willingness to focus on calm, diaphragmatic breathing.  ASSESSMENT Patient presents to clinic with excellent motivation to participate in therapy. Patient demonstrates deficits in lumbar spine  mobility, RLE strength, PFM coordination, strength, and extensibility. Patient demonstrating palpable lengthening of PFM with coordinated breath pattern during today's session and responded positively to educational interventions. Patient will benefit from continued skilled therapeutic intervention to address remaining deficits in lumbar spine mobility, RLE strength, PFM coordination, strength, and extensibility in order to decreased incidence on incontinence, increase function, and improve overall QOL.   PT Long Term Goals -  02/21/19 1849      PT LONG TERM GOAL #1   Title  Patient will report BMs classified as Type 3-Type 4 on the [facility] Stool Chart greater than 50% of the time to demonstrate improved motility and stool bulking in order to decrease fecal distress and improve overall QOL.    Baseline  IE: Type 1/ Type 7 100%    Time  12    Period  Weeks    Status  New    Target Date  05/16/19      PT LONG TERM GOAL #2   Title  Patient will report >2 weeks without episode of fecal incontinence/smearing for improved function and participation at home and in the community.    Baseline  IE: 2+ times/ week    Time  12    Period  Weeks    Status  New    Target Date  05/16/19      PT LONG TERM GOAL #3   Title  Patient will report increased bowel movement consistency from worst (Type 1) 2 x / week to (Type 3/4) 4 x/ week to demonstrate improved bowel and PFM function for decrease limitations of participation at home and in the community.    Baseline  IE: Type 1 2 x/week    Time  12    Period  Weeks    Status  New    Target Date  05/16/19      PT LONG TERM GOAL #4   Title  Patient will indicate at least a 36 point difference on the PFIQ-7 short form to demonstrate clinically significant improvement for a restoration/improvement in function at home and in the community.    Baseline  IE: 86    Time  12    Period  Weeks    Status  New    Target Date  05/16/19      PT LONG TERM GOAL #5   Title  Patient will indicate >/= a 3.56 point decrease or a score below 30 on the FISI to demonstrate clinically significant improvement in fecal incontinence for improved participation and overall QOL.    Baseline  IE: 42    Time  12    Period  Weeks    Status  New    Target Date  05/16/19            Plan - 03/28/19 1655    Clinical Impression Statement  Patient presents to clinic with excellent motivation to participate in therapy. Patient demonstrates deficits in lumbar spine mobility, RLE strength, PFM  coordination, strength, and extensibility. Patient demonstrating palpable lengthening of PFM with coordinated breath pattern during today's session and responded positively to educational interventions. Patient will benefit from continued skilled therapeutic intervention to address remaining deficits in lumbar spine mobility, RLE strength, PFM coordination, strength, and extensibility in order to decreased incidence on incontinence, increase function, and improve overall QOL.    Personal Factors and Comorbidities  Age;Fitness;Past/Current Experience;Behavior Pattern;Comorbidity 3+;Time since onset of injury/illness/exacerbation    Comorbidities  HTN, anxiety, arthritis, depression  Examination-Activity Limitations  Transfers;Continence;Lift;Hygiene/Grooming;Toileting;Squat;Bend    Examination-Participation Restrictions  Cleaning;Yard Work;Laundry;Driving;Shop;Interpersonal Relationship    Stability/Clinical Decision Making  Evolving/Moderate complexity    Rehab Potential  Fair    PT Frequency  1x / week    PT Duration  12 weeks    PT Treatment/Interventions  ADLs/Self Care Home Management;Biofeedback;Electrical Stimulation;Moist Heat;Cryotherapy;Balance training;Therapeutic exercise;Therapeutic activities;Neuromuscular re-education;Patient/family education;Dry needling;Manual techniques;Splinting;Taping;Spinal Manipulations;Joint Manipulations    PT Next Visit Plan  IAP basics    PT Home Exercise Plan  diaphragmatic breathing, PFM lengthening    Consulted and Agree with Plan of Care  Patient       Patient will benefit from skilled therapeutic intervention in order to improve the following deficits and impairments:  Decreased coordination, Increased fascial restricitons, Obesity, Pain, Increased muscle spasms, Decreased endurance, Decreased balance, Decreased strength, Postural dysfunction, Improper body mechanics, Impaired flexibility  Visit Diagnosis: Abnormal posture  Other lack of  coordination  Muscle weakness (generalized)     Problem List Patient Active Problem List   Diagnosis Date Noted  . SVT (supraventricular tachycardia) ([place]) 04/10/2017  . Degenerative disc disease, cervical 04/10/2017  . Overweight (BMI 25.0-29.9) 09/25/2014  . Pure hypercholesterolemia 05/31/2012  . Other abnormal glucose 05/31/2012  . Vitamin D deficiency 05/31/2012  . Anxiety and depression 05/31/2012  . Blood pressure elevated 05/31/2012   Myles Gip PT, DPT [phone] 03/28/2019, 7:28 PM   [facility] [address] [place], [place], [address] Phone: [phone]   Fax:  [phone]  Name: DIONA PEREGOY MRN: [id number] Date of Birth: August 31, 1960

## 2019-04-04 ENCOUNTER — Encounter: Payer: PRIVATE HEALTH INSURANCE | Admitting: Physical Therapy

## 2019-04-11 ENCOUNTER — Ambulatory Visit: Payer: PRIVATE HEALTH INSURANCE | Admitting: Physical Therapy

## 2019-04-19 ENCOUNTER — Other Ambulatory Visit: Payer: Self-pay

## 2019-04-19 ENCOUNTER — Encounter: Payer: Self-pay | Admitting: Physical Therapy

## 2019-04-19 ENCOUNTER — Ambulatory Visit: Payer: PRIVATE HEALTH INSURANCE | Attending: Gastroenterology | Admitting: Physical Therapy

## 2019-04-19 DIAGNOSIS — M6281 Muscle weakness (generalized): Secondary | ICD-10-CM | POA: Diagnosis present

## 2019-04-19 DIAGNOSIS — R293 Abnormal posture: Secondary | ICD-10-CM | POA: Insufficient documentation

## 2019-04-19 DIAGNOSIS — R278 Other lack of coordination: Secondary | ICD-10-CM | POA: Insufficient documentation

## 2019-04-19 NOTE — Therapy (Signed)
[facility] [address] [place], [place], [address] Phone: [phone]   Fax:  [phone]  Physical Therapy Treatment  Patient Details  Name: Karina Sampson MRN: [id number] Date of Birth: [birth year] Referring Provider (PT): Ledon Snare   Encounter Date: 04/19/2019  PT End of Session - 04/19/19 1700    Visit Number  5    Number of Visits  13    Date for PT Re-Evaluation  05/16/19    Authorization Time Period  IE 02/21/2019    PT Start Time  1655    PT Stop Time  1750    PT Time Calculation (min)  55 min    Activity Tolerance  Patient tolerated treatment well    Behavior During Therapy  [facility] for tasks assessed/performed       Past Medical History:  Diagnosis Date  . Allergic rhinitis, cause unspecified   . Allergy   . Anemia, unspecified   . Anxiety   . Arthritis   . Arthropathy, unspecified, site unspecified   . Breast nodule    Right  . Depression   . Headache(784.0)   . Heart murmur   . Hypertension   . Obesity, unspecified   . Other B-complex deficiencies   . Pure hypercholesterolemia   . SVT (supraventricular tachycardia) (HCC)   . Symptomatic menopausal or female climacteric states   . Tobacco use disorder   . Ulcer disease   . Unspecified disease of nail   . Unspecified vitamin D deficiency     Past Surgical History:  Procedure Laterality Date  . ELECTROPHYSIOLOGY STUDY  1997  . EYE SURGERY  2001   LASIK- BOTH  . svt surgery     with ablation  . wisdom teeth extractions      There were no vitals filed for this visit.  Subjective Assessment - 04/19/19 1656    Subjective  Patient notes she has been having a significant number of stressors between covid+ coworkers, gas leaks at work. Patient states that nothing much has changed since her last visit. Patient reports doing her R hip flexor stretches intermittently, meditation/breathing exercises on occasion without much success, and has not done her  PFM lengthening.    Currently in Pain?  Yes    Pain Location  Hip    Pain Orientation  Right    Pain Descriptors / Indicators  Sore       TREATMENT  Neuromuscular Re-education: Patient educated extensively on the intra-abdominal pressure system and management strategies for increased pressures. Patient benefited from visual aids during education.   Patient educated on possible anatomical theories for relationship between bowel and R hip pain (re: psoas muscle pathway) and benefited from visual aids during education.  Motivational interviewing techniques used to collaborate with patient on strategies for improved adherence to HEP for improved outcomes. Patient agreed to approach of linking various HEP tasks to different parts of her day.    Patient Response to interventions: Patient states "We'll give this a try."  ASSESSMENT Patient presents to clinic with excellent motivation to participate in therapy. Patient demonstrates deficits in lumbar spine mobility, RLE strength, PFM coordination, strength, and extensibility. Patient engaged in education around IAP system and strategies for improved adherence to HEP during today's session and responded positively to educational interventions. Patient will benefit from continued skilled therapeutic intervention to address remaining deficits in lumbar spine mobility, RLE strength, PFM coordination, strength, and extensibility in order to decreased incidence on incontinence, increase function,  and improve overall QOL.    PT Long Term Goals - 02/21/19 1849      PT LONG TERM GOAL #1   Title  Patient will report BMs classified as Type 3-Type 4 on the [facility] Stool Chart greater than 50% of the time to demonstrate improved motility and stool bulking in order to decrease fecal distress and improve overall QOL.    Baseline  IE: Type 1/ Type 7 100%    Time  12    Period  Weeks    Status  New    Target Date  05/16/19      PT LONG TERM GOAL #2    Title  Patient will report >2 weeks without episode of fecal incontinence/smearing for improved function and participation at home and in the community.    Baseline  IE: 2+ times/ week    Time  12    Period  Weeks    Status  New    Target Date  05/16/19      PT LONG TERM GOAL #3   Title  Patient will report increased bowel movement consistency from worst (Type 1) 2 x / week to (Type 3/4) 4 x/ week to demonstrate improved bowel and PFM function for decrease limitations of participation at home and in the community.    Baseline  IE: Type 1 2 x/week    Time  12    Period  Weeks    Status  New    Target Date  05/16/19      PT LONG TERM GOAL #4   Title  Patient will indicate at least a 36 point difference on the PFIQ-7 short form to demonstrate clinically significant improvement for a restoration/improvement in function at home and in the community.    Baseline  IE: 86    Time  12    Period  Weeks    Status  New    Target Date  05/16/19      PT LONG TERM GOAL #5   Title  Patient will indicate >/= a 3.56 point decrease or a score below 30 on the FISI to demonstrate clinically significant improvement in fecal incontinence for improved participation and overall QOL.    Baseline  IE: 42    Time  12    Period  Weeks    Status  New    Target Date  05/16/19            Plan - 04/19/19 1715    Clinical Impression Statement  Patient presents to clinic with excellent motivation to participate in therapy. Patient demonstrates deficits in lumbar spine mobility, RLE strength, PFM coordination, strength, and extensibility. Patient engaged in education around IAP system and strategies for improved adherence to HEP during today's session and responded positively to educational interventions. Patient will benefit from continued skilled therapeutic intervention to address remaining deficits in lumbar spine mobility, RLE strength, PFM coordination, strength, and extensibility in order to decreased  incidence on incontinence, increase function, and improve overall QOL.    Personal Factors and Comorbidities  Age;Fitness;Past/Current Experience;Behavior Pattern;Comorbidity 3+;Time since onset of injury/illness/exacerbation    Comorbidities  HTN, anxiety, arthritis, depression    Examination-Activity Limitations  Transfers;Continence;Lift;Hygiene/Grooming;Toileting;Squat;Bend    Examination-Participation Restrictions  Cleaning;Yard Work;Laundry;Driving;Shop;Interpersonal Relationship    Stability/Clinical Decision Making  Evolving/Moderate complexity    Rehab Potential  Fair    PT Frequency  1x / week    PT Duration  12 weeks    PT Treatment/Interventions  ADLs/Self Care  Home Management;Biofeedback;Electrical Stimulation;Moist Heat;Cryotherapy;Balance training;Therapeutic exercise;Therapeutic activities;Neuromuscular re-education;Patient/family education;Dry needling;Manual techniques;Splinting;Taping;Spinal Manipulations;Joint Manipulations    PT Next Visit Plan  IAP basics    PT Home Exercise Plan  diaphragmatic breathing, PFM lengthening    Consulted and Agree with Plan of Care  Patient       Patient will benefit from skilled therapeutic intervention in order to improve the following deficits and impairments:  Decreased coordination, Increased fascial restricitons, Obesity, Pain, Increased muscle spasms, Decreased endurance, Decreased balance, Decreased strength, Postural dysfunction, Improper body mechanics, Impaired flexibility  Visit Diagnosis: Abnormal posture  Other lack of coordination  Muscle weakness (generalized)     Problem List Patient Active Problem List   Diagnosis Date Noted  . SVT (supraventricular tachycardia) ([place]) 04/10/2017  . Degenerative disc disease, cervical 04/10/2017  . Overweight (BMI 25.0-29.9) 09/25/2014  . Pure hypercholesterolemia 05/31/2012  . Other abnormal glucose 05/31/2012  . Vitamin D deficiency 05/31/2012  . Anxiety and depression  05/31/2012  . Blood pressure elevated 05/31/2012    Myles Gip PT, DPT [phone] 04/19/2019, 6:09 PM  [facility] [address] [place], [place], [address] Phone: [phone]   Fax:  [phone]  Name: Karina Sampson MRN: [id number] Date of Birth: Mar 07, 1961

## 2019-04-25 ENCOUNTER — Ambulatory Visit: Payer: PRIVATE HEALTH INSURANCE | Admitting: Physical Therapy

## 2019-04-25 ENCOUNTER — Other Ambulatory Visit: Payer: Self-pay

## 2019-04-25 ENCOUNTER — Encounter: Payer: Self-pay | Admitting: Physical Therapy

## 2019-04-25 DIAGNOSIS — R278 Other lack of coordination: Secondary | ICD-10-CM

## 2019-04-25 DIAGNOSIS — M6281 Muscle weakness (generalized): Secondary | ICD-10-CM

## 2019-04-25 DIAGNOSIS — R293 Abnormal posture: Secondary | ICD-10-CM

## 2019-04-25 NOTE — Therapy (Signed)
[facility] [address]. [place], [place], [address] Phone: [phone]   Fax:  [phone]  Physical Therapy Treatment  Patient Details  Name: Karina Sampson MRN: [id number] Date of Birth: 1961/02/19 Referring Provider (PT): Ledon Snare   Encounter Date: 04/25/2019  PT End of Session - 04/25/19 1710    Visit Number  6    Number of Visits  13    Date for PT Re-Evaluation  05/16/19    Authorization Time Period  IE 02/21/2019    PT Start Time  1700    PT Stop Time  1755    PT Time Calculation (min)  55 min    Activity Tolerance  Patient tolerated treatment well    Behavior During Therapy  [facility] for tasks assessed/performed       Past Medical History:  Diagnosis Date  . Allergic rhinitis, cause unspecified   . Allergy   . Anemia, unspecified   . Anxiety   . Arthritis   . Arthropathy, unspecified, site unspecified   . Breast nodule    Right  . Depression   . Headache(784.0)   . Heart murmur   . Hypertension   . Obesity, unspecified   . Other B-complex deficiencies   . Pure hypercholesterolemia   . SVT (supraventricular tachycardia) (HCC)   . Symptomatic menopausal or female climacteric states   . Tobacco use disorder   . Ulcer disease   . Unspecified disease of nail   . Unspecified vitamin D deficiency     Past Surgical History:  Procedure Laterality Date  . ELECTROPHYSIOLOGY STUDY  1997  . EYE SURGERY  2001   LASIK- BOTH  . svt surgery     with ablation  . wisdom teeth extractions      There were no vitals filed for this visit.  Subjective Assessment - 04/25/19 1702    Subjective  Patient presents to clinic with completed HEP tracker. Patient notes that she hasn't noticed any differences as a result of doing her HEP. Patient reports the exercises were not hard to fit in. Patient continues to have intermnittent pain without any clear cause in the RLQ. Patient reports 6-7/10 pain this past week; mainly  sitting/riding in the car.    Currently in Pain?  No/denies    Pain Location  Abdomen    Pain Orientation  Right;Lower       TREATMENT  Neuromuscular Re-education: Supine hooklying diaphragmatic breathing with VCs and TCs for downregulation of the nervous system and improved management of IAP Sidelying, thoracolumbar rotations (open-book) bilaterally with diaphragmatic breathing for improved diaphragmatic and rib cage excursion. VCs and TCs to prevent compensations. Supine hooklying with unilateral overhead reach for improved thoracic extension with coordinated breath to improve rib cage excursion. VCs and TCs to prevent compensations. Supine hooklying, PFM lengthening with inhalation. VCs and TCs to decrease compensatory patterns and encourage optimal relaxation of the PFM. Supine hooklying, anterior pelvic tilt with PFM lengthening with inhalation. VCs and TCs to decrease compensatory patterns and encourage optimal relaxation of the PFM.    ASSESSMENT Patient presents to clinic with excellent motivation to participate in therapy. Patient demonstrates deficits in lumbar spine mobility, RLE strength, PFM coordination, strength, and extensibility. Patient able to improve L thoracic rotation with TCs with open-book exercise during today's session and responded positively to active interventions. Patient will benefit from continued skilled therapeutic intervention to address remaining deficits in lumbar spine mobility, RLE strength, PFM coordination, strength, and extensibility  in order to decreased incidence on incontinence, increase function, and improve overall QOL.    PT Long Term Goals - 02/21/19 1849      PT LONG TERM GOAL #1   Title  Patient will report BMs classified as Type 3-Type 4 on the [facility] Stool Chart greater than 50% of the time to demonstrate improved motility and stool bulking in order to decrease fecal distress and improve overall QOL.    Baseline  IE: Type 1/ Type 7 100%     Time  12    Period  Weeks    Status  New    Target Date  05/16/19      PT LONG TERM GOAL #2   Title  Patient will report >2 weeks without episode of fecal incontinence/smearing for improved function and participation at home and in the community.    Baseline  IE: 2+ times/ week    Time  12    Period  Weeks    Status  New    Target Date  05/16/19      PT LONG TERM GOAL #3   Title  Patient will report increased bowel movement consistency from worst (Type 1) 2 x / week to (Type 3/4) 4 x/ week to demonstrate improved bowel and PFM function for decrease limitations of participation at home and in the community.    Baseline  IE: Type 1 2 x/week    Time  12    Period  Weeks    Status  New    Target Date  05/16/19      PT LONG TERM GOAL #4   Title  Patient will indicate at least a 36 point difference on the PFIQ-7 short form to demonstrate clinically significant improvement for a restoration/improvement in function at home and in the community.    Baseline  IE: 86    Time  12    Period  Weeks    Status  New    Target Date  05/16/19      PT LONG TERM GOAL #5   Title  Patient will indicate >/= a 3.56 point decrease or a score below 30 on the FISI to demonstrate clinically significant improvement in fecal incontinence for improved participation and overall QOL.    Baseline  IE: 42    Time  12    Period  Weeks    Status  New    Target Date  05/16/19            Plan - 04/25/19 1710    Clinical Impression Statement  Patient presents to clinic with excellent motivation to participate in therapy. Patient demonstrates deficits in lumbar spine mobility, RLE strength, PFM coordination, strength, and extensibility. Patient able to improve L thoracic rotation with TCs with open-book exercise during today's session and responded positively to active interventions. Patient will benefit from continued skilled therapeutic intervention to address remaining deficits in lumbar spine mobility,  RLE strength, PFM coordination, strength, and extensibility in order to decreased incidence on incontinence, increase function, and improve overall QOL.    Personal Factors and Comorbidities  Age;Fitness;Past/Current Experience;Behavior Pattern;Comorbidity 3+;Time since onset of injury/illness/exacerbation    Comorbidities  HTN, anxiety, arthritis, depression    Examination-Activity Limitations  Transfers;Continence;Lift;Hygiene/Grooming;Toileting;Squat;Bend    Examination-Participation Restrictions  Cleaning;Yard Work;Laundry;Driving;Shop;Interpersonal Relationship    Stability/Clinical Decision Making  Evolving/Moderate complexity    Rehab Potential  Fair    PT Frequency  1x / week    PT Duration  12 weeks  PT Treatment/Interventions  ADLs/Self Care Home Management;Biofeedback;Electrical Stimulation;Moist Heat;Cryotherapy;Balance training;Therapeutic exercise;Therapeutic activities;Neuromuscular re-education;Patient/family education;Dry needling;Manual techniques;Splinting;Taping;Spinal Manipulations;Joint Manipulations    PT Next Visit Plan  Assess PFM    PT Home Exercise Plan  Access Code: [email or web address]    Consulted and Agree with Plan of Care  Patient       Patient will benefit from skilled therapeutic intervention in order to improve the following deficits and impairments:  Decreased coordination, Increased fascial restricitons, Obesity, Pain, Increased muscle spasms, Decreased endurance, Decreased balance, Decreased strength, Postural dysfunction, Improper body mechanics, Impaired flexibility  Visit Diagnosis: Abnormal posture  Other lack of coordination  Muscle weakness (generalized)     Problem List Patient Active Problem List   Diagnosis Date Noted  . SVT (supraventricular tachycardia) (HCC) 04/10/2017  . Degenerative disc disease, cervical 04/10/2017  . Overweight (BMI 25.0-29.9) 09/25/2014  . Pure hypercholesterolemia 05/31/2012  . Other abnormal glucose 05/31/2012  .  Vitamin D deficiency 05/31/2012  . Anxiety and depression 05/31/2012  . Blood pressure elevated 05/31/2012   Sheria LangKatlin Tannie Koskela PT, DPT [phone] 04/25/2019, 6:13 PM  [facility] [address] [place], [place], [address] Phone: [phone]   Fax:  [phone]  Name: Murrell ReddenVicky L Medaglia MRN: [id number] Date of Birth: 04/23/1961

## 2019-05-02 ENCOUNTER — Encounter: Payer: Self-pay | Admitting: Physical Therapy

## 2019-05-02 ENCOUNTER — Other Ambulatory Visit: Payer: Self-pay

## 2019-05-02 ENCOUNTER — Ambulatory Visit: Payer: PRIVATE HEALTH INSURANCE | Admitting: Physical Therapy

## 2019-05-02 DIAGNOSIS — R293 Abnormal posture: Secondary | ICD-10-CM | POA: Diagnosis not present

## 2019-05-02 DIAGNOSIS — R278 Other lack of coordination: Secondary | ICD-10-CM

## 2019-05-02 DIAGNOSIS — M6281 Muscle weakness (generalized): Secondary | ICD-10-CM

## 2019-05-03 NOTE — Therapy (Signed)
[facility] [address] [place], [place], [address] Phone: [phone]   Fax:  [phone]  Physical Therapy Treatment  Patient Details  Name: Karina Sampson MRN: [id number] Date of Birth: [birth year] Referring Provider (PT): Ellis Savage   Encounter Date: 05/02/2019  PT End of Session - 05/02/19 1703    Visit Number  7    Number of Visits  13    Date for PT Re-Evaluation  05/16/19    Authorization Time Period  IE 02/21/2019    PT Start Time  1700    PT Stop Time  1755    PT Time Calculation (min)  55 min    Activity Tolerance  Patient tolerated treatment well    Behavior During Therapy  [facility] for tasks assessed/performed       Past Medical History:  Diagnosis Date  . Allergic rhinitis, cause unspecified   . Allergy   . Anemia, unspecified   . Anxiety   . Arthritis   . Arthropathy, unspecified, site unspecified   . Breast nodule    Right  . Depression   . Headache(784.0)   . Heart murmur   . Hypertension   . Obesity, unspecified   . Other B-complex deficiencies   . Pure hypercholesterolemia   . SVT (supraventricular tachycardia) ([place])   . Symptomatic menopausal or female climacteric states   . Tobacco use disorder   . Ulcer disease   . Unspecified disease of nail   . Unspecified vitamin D deficiency     Past Surgical History:  Procedure Laterality Date  . ELECTROPHYSIOLOGY STUDY  1997  . EYE SURGERY  2001   LASIK- BOTH  . svt surgery     with ablation  . wisdom teeth extractions      There were no vitals filed for this visit.  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 Patient notes she is already stiff and sore from this. (5/10) Patient notes that she continues to do her exercises and sees no change in her status. Patient continues to have pain in RLQ without much relief.    Currently in  Pain?  Yes    Pain Score  6     Pain Location  Back    Pain Orientation  Lower;Mid;Upper    Pain Descriptors / Indicators  Aching;Sore       TREATMENT  Neuromuscular Re-education: Patient education on: bowel health, bowel control, holding on, and types of fiber. Patient educated extensively on pelvic floor muscles: slow twitch v fast twitch and strengthening theory.  Reassessed PFIQ-7: 38 point decrease indicating clinically significant change.   ASSESSMENT Patient presents to clinic with excellent motivation to participate in therapy. Patient demonstrates deficits in lumbar spine mobility, RLE strength, PFM coordination, strength, and extensibility. Patient set new water intake goal of 2.5 glasses/day during today's session and responded positively to educational interventions. Patient will benefit from continued skilled therapeutic intervention to address remaining deficits in lumbar spine mobility, RLE strength, PFM coordination, strength, and extensibility in order to decreased incidence on incontinence, increase function, and improve overall QOL.     PT Long Term Goals - 02/21/19 1849      PT LONG TERM GOAL #1   Title  Patient will report BMs classified as Type 3-Type 4 on the [facility] Stool Chart greater than 50% of the time to demonstrate  improved motility and stool bulking in order to decrease fecal distress and improve overall QOL.    Baseline  IE: Type 1/ Type 7 100%    Time  12    Period  Weeks    Status  New    Target Date  05/16/19      PT LONG TERM GOAL #2   Title  Patient will report >2 weeks without episode of fecal incontinence/smearing for improved function and participation at home and in the community.    Baseline  IE: 2+ times/ week    Time  12    Period  Weeks    Status  New    Target Date  05/16/19      PT LONG TERM GOAL #3   Title  Patient will report increased bowel movement consistency from worst (Type 1) 2 x / week to (Type 3/4) 4 x/ week to  demonstrate improved bowel and PFM function for decrease limitations of participation at home and in the community.    Baseline  IE: Type 1 2 x/week    Time  12    Period  Weeks    Status  New    Target Date  05/16/19      PT LONG TERM GOAL #4   Title  Patient will indicate at least a 36 point difference on the PFIQ-7 short form to demonstrate clinically significant improvement for a restoration/improvement in function at home and in the community.    Baseline  IE: 86    Time  12    Period  Weeks    Status  New    Target Date  05/16/19      PT LONG TERM GOAL #5   Title  Patient will indicate >/= a 3.56 point decrease or a score below 30 on the FISI to demonstrate clinically significant improvement in fecal incontinence for improved participation and overall QOL.    Baseline  IE: 42    Time  12    Period  Weeks    Status  New    Target Date  05/16/19            Plan - 05/02/19 1704    Clinical Impression Statement  Patient presents to clinic with excellent motivation to participate in therapy. Patient demonstrates deficits in lumbar spine mobility, RLE strength, PFM coordination, strength, and extensibility. Patient set new water intake goal of 2.5 glasses/day during today's session and responded positively to educational interventions. Patient will benefit from continued skilled therapeutic intervention to address remaining deficits in lumbar spine mobility, RLE strength, PFM coordination, strength, and extensibility in order to decreased incidence on incontinence, increase function, and improve overall QOL.    Personal Factors and Comorbidities  Age;Fitness;Past/Current Experience;Behavior Pattern;Comorbidity 3+;Time since onset of injury/illness/exacerbation    Comorbidities  HTN, anxiety, arthritis, depression    Examination-Activity Limitations  Transfers;Continence;Lift;Hygiene/Grooming;Toileting;Squat;Bend    Examination-Participation Restrictions  Cleaning;Yard  Work;Laundry;Driving;Shop;Interpersonal Relationship    Stability/Clinical Decision Making  Evolving/Moderate complexity    Rehab Potential  Fair    PT Frequency  1x / week    PT Duration  12 weeks    PT Treatment/Interventions  ADLs/Self Care Home Management;Biofeedback;Electrical Stimulation;Moist Heat;Cryotherapy;Balance training;Therapeutic exercise;Therapeutic activities;Neuromuscular re-education;Patient/family education;Dry needling;Manual techniques;Splinting;Taping;Spinal Manipulations;Joint Manipulations    PT Next Visit Plan  Assess PFM    PT Home Exercise Plan  Access Code: [email or web address]    Consulted and Agree with Plan of Care  Patient       Patient will benefit  from skilled therapeutic intervention in order to improve the following deficits and impairments:  Decreased coordination, Increased fascial restricitons, Obesity, Pain, Increased muscle spasms, Decreased endurance, Decreased balance, Decreased strength, Postural dysfunction, Improper body mechanics, Impaired flexibility  Visit Diagnosis: Abnormal posture  Other lack of coordination  Muscle weakness (generalized)     Problem List Patient Active Problem List   Diagnosis Date Noted  . SVT (supraventricular tachycardia) (HCC) 04/10/2017  . Degenerative disc disease, cervical 04/10/2017  . Overweight (BMI 25.0-29.9) 09/25/2014  . Pure hypercholesterolemia 05/31/2012  . Other abnormal glucose 05/31/2012  . Vitamin D deficiency 05/31/2012  . Anxiety and depression 05/31/2012  . Blood pressure elevated 05/31/2012   Sheria LangKatlin Yoselyn Mcglade PT, DPT [phone] 05/03/2019, 4:22 PM  [facility] [address] [place], [place], [address] Phone: [phone]   Fax:  [phone]  Name: Karina Sampson MRN: [id number] Date of Birth: 09/28/1960

## 2019-05-09 ENCOUNTER — Ambulatory Visit: Payer: PRIVATE HEALTH INSURANCE | Admitting: Physical Therapy

## 2019-08-05 ENCOUNTER — Ambulatory Visit: Payer: PRIVATE HEALTH INSURANCE | Attending: Internal Medicine

## 2019-08-05 DIAGNOSIS — Z23 Encounter for immunization: Secondary | ICD-10-CM

## 2019-08-05 NOTE — Progress Notes (Signed)
   Covid-19 Vaccination Clinic  Name:  Karina Sampson    MRN: [id number] DOB: Sep 19, 1960  08/05/2019  [name] was observed post Covid-19 immunization for 15 minutes without incident. She was provided with Vaccine Information Sheet and instruction to access the V-Safe system.   [name] was instructed to call 911 with any severe reactions post vaccine: Marland Kitchen Difficulty breathing  . Swelling of face and throat  . A fast heartbeat  . A bad rash all over body  . Dizziness and weakness   Immunizations Administered    Name Date Dose VIS Date Route   Pfizer COVID-19 Vaccine 08/05/2019  2:03 PM 0.3 mL 04/22/2019 Intramuscular   Manufacturer: [employer], [employer]   Lot: [id number]   NDC: [address]-4

## 2019-08-30 ENCOUNTER — Ambulatory Visit: Payer: PRIVATE HEALTH INSURANCE | Attending: Internal Medicine

## 2019-08-30 DIAGNOSIS — Z23 Encounter for immunization: Secondary | ICD-10-CM

## 2019-08-30 NOTE — Progress Notes (Signed)
   Covid-19 Vaccination Clinic  Name:  Karina Sampson    MRN: [id number] DOB: Nov 27, 1960  08/30/2019  [name] was observed post Covid-19 immunization for 15 minutes without incident. She was provided with Vaccine Information Sheet and instruction to access the V-Safe system.   [name] was instructed to call 911 with any severe reactions post vaccine: Marland Kitchen Difficulty breathing  . Swelling of face and throat  . A fast heartbeat  . A bad rash all over body  . Dizziness and weakness   Immunizations Administered    Name Date Dose VIS Date Route   Pfizer COVID-19 Vaccine 08/30/2019  3:53 PM 0.3 mL 07/06/2018 Intramuscular   Manufacturer: [employer], [employer]   Lot: [id number]   NDC: [address]-5

## 2019-10-25 ENCOUNTER — Encounter (INDEPENDENT_AMBULATORY_CARE_PROVIDER_SITE_OTHER): Payer: Self-pay

## 2019-11-16 IMAGING — MG DIGITAL SCREENING BILATERAL MAMMOGRAM WITH TOMO AND CAD
8 series · 8 of 24 positions shown · non-contrast
Comparison: Previous exam(s).

CLINICAL DATA: Screening.

EXAM:
DIGITAL SCREENING BILATERAL MAMMOGRAM WITH TOMO AND CAD

[R MLO synth-2D]
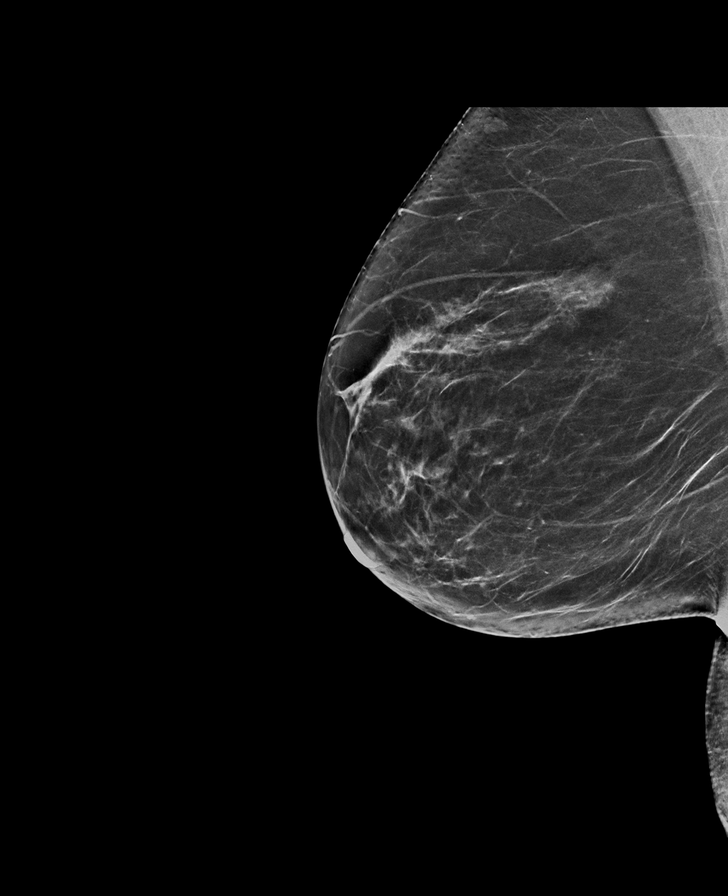

[L CC synth-2D]
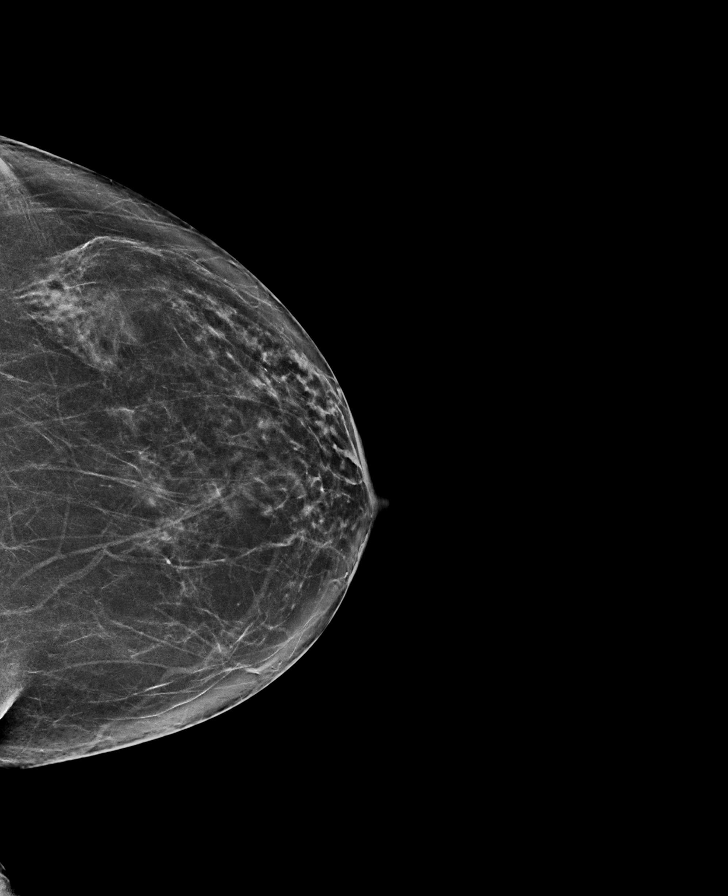

[R CC synth-2D]
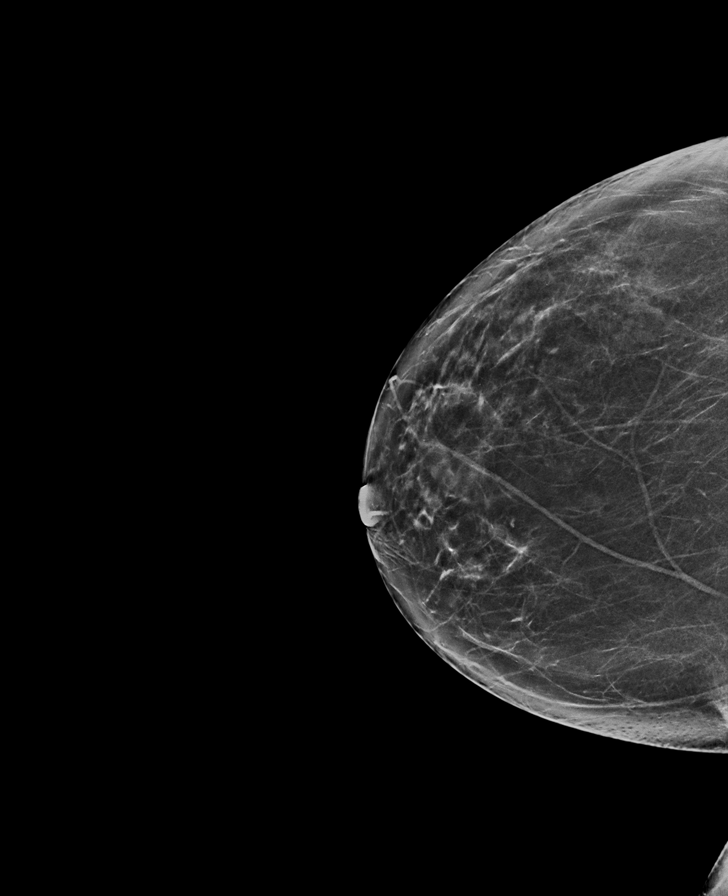

[L MLO synth-2D]
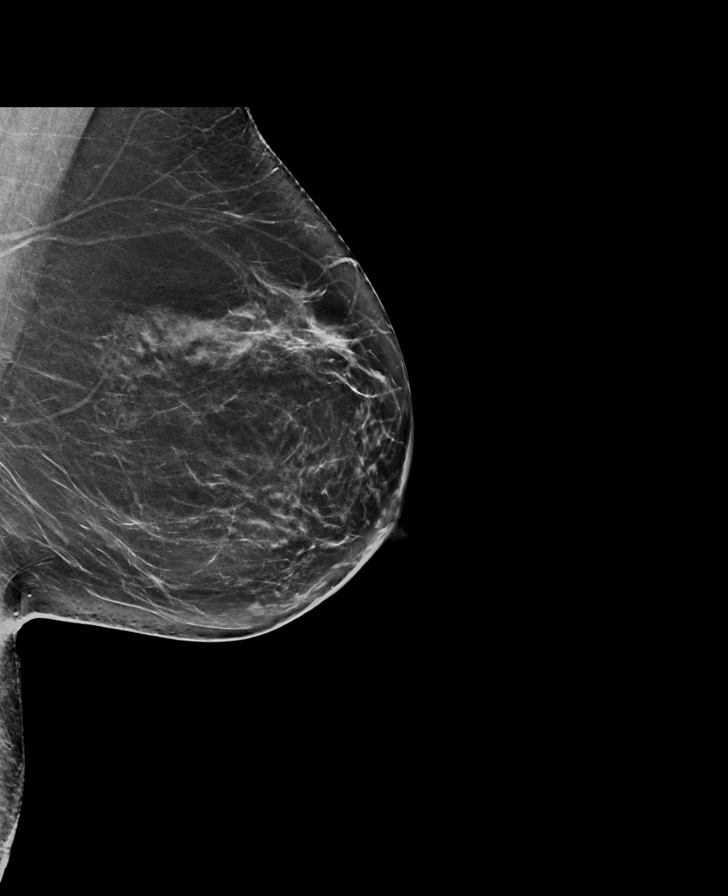

[L MLO tomo · tomo slice 39/76.0]
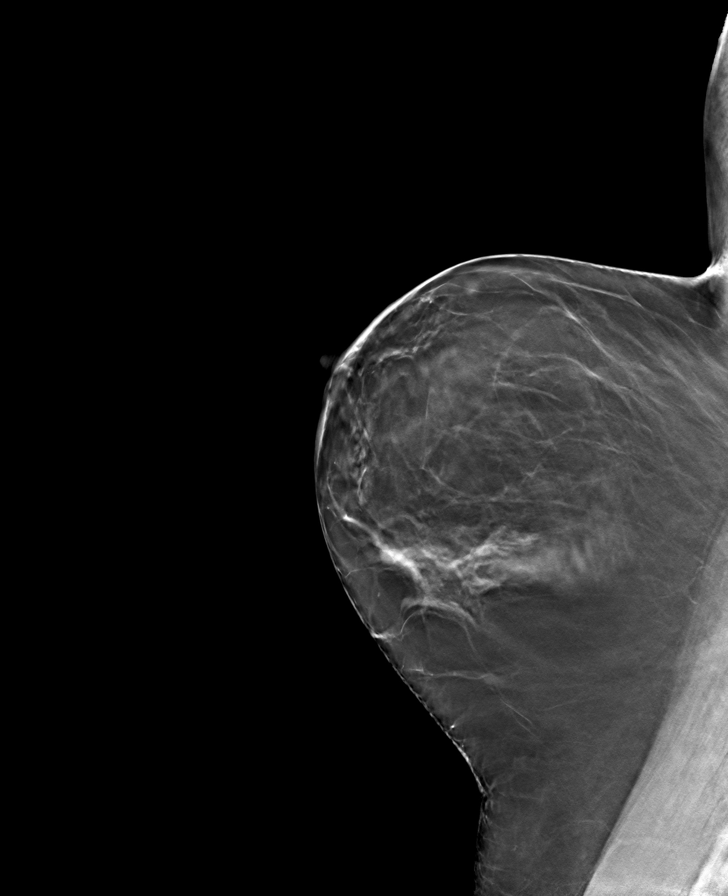

[L CC tomo · tomo slice 35/69.0]
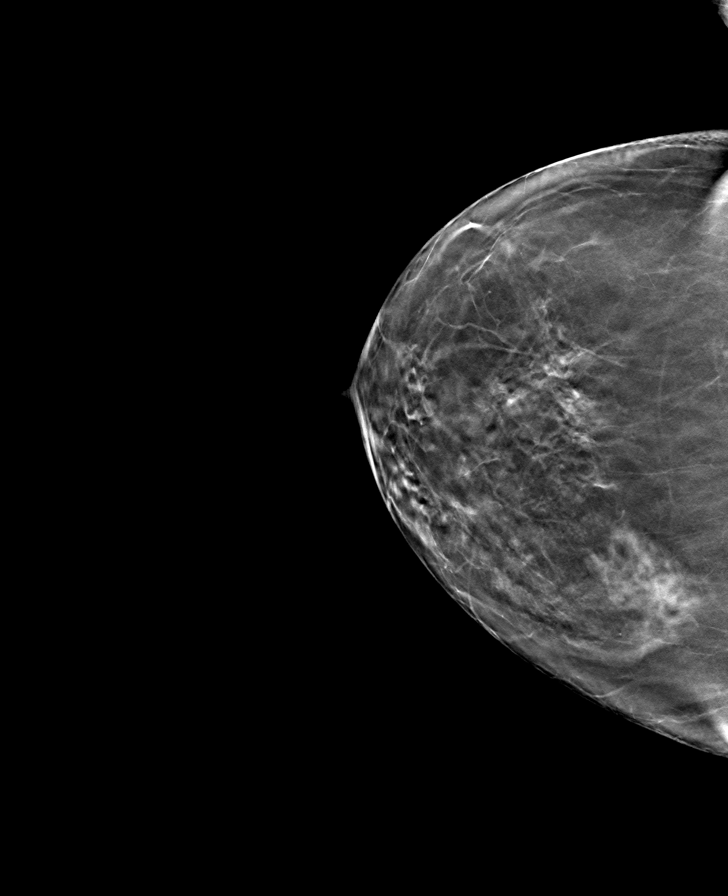

[R CC tomo · tomo slice 35/70.0]
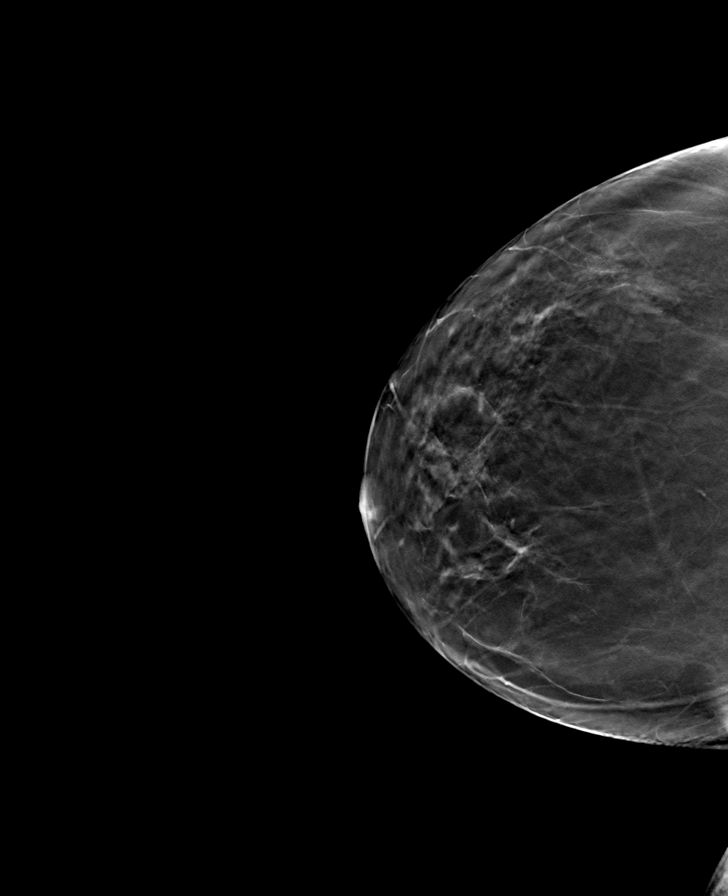

[R MLO tomo · tomo slice 35/68.0]
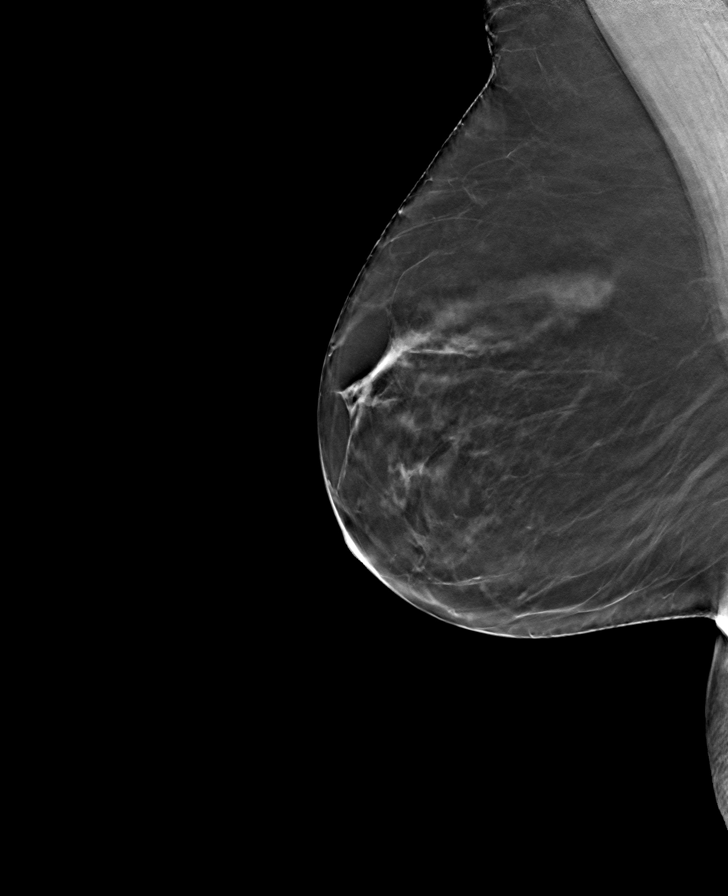

[8 of 24 positions shown; findings below may reference images not displayed]

ACR Breast Density Category b: There are scattered areas of
fibroglandular density.
FINDINGS: There are no findings suspicious for malignancy. Images were
processed with CAD.
IMPRESSION: No mammographic evidence of malignancy. A result letter of this
screening mammogram will be mailed directly to the patient.

RECOMMENDATION:
Screening mammogram in one year. (Code:[id number])

BI-RADS CATEGORY  1: Negative.

## 2020-04-26 ENCOUNTER — Other Ambulatory Visit: Payer: Self-pay | Admitting: Family Medicine

## 2020-04-26 DIAGNOSIS — Z1231 Encounter for screening mammogram for malignant neoplasm of breast: Secondary | ICD-10-CM

## 2020-07-24 ENCOUNTER — Other Ambulatory Visit: Payer: Self-pay | Admitting: Physician Assistant

## 2020-07-24 DIAGNOSIS — R079 Chest pain, unspecified: Secondary | ICD-10-CM

## 2020-08-06 ENCOUNTER — Other Ambulatory Visit: Payer: Self-pay

## 2020-08-06 ENCOUNTER — Ambulatory Visit
Admission: RE | Admit: 2020-08-06 | Discharge: 2020-08-06 | Disposition: A | Discharge status: Home / Self Care | Payer: PRIVATE HEALTH INSURANCE | Source: Ambulatory Visit | Attending: Physician Assistant | Admitting: Physician Assistant

## 2020-08-06 DIAGNOSIS — R079 Chest pain, unspecified: Secondary | ICD-10-CM | POA: Diagnosis not present

## 2020-08-06 DIAGNOSIS — I10 Essential (primary) hypertension: Secondary | ICD-10-CM | POA: Insufficient documentation

## 2020-08-06 LAB — ECHOCARDIOGRAM COMPLETE
AR max vel: 2.18 cm2
AV Area VTI: 2.88 cm2
AV Area mean vel: 2.09 cm2
AV Mean grad: 5 mmHg
AV Peak grad: 8.2 mmHg
Ao pk vel: 1.43 m/s
Area-P 1/2: 5.13 cm2
S' Lateral: 2.08 cm

## 2020-08-06 NOTE — Progress Notes (Signed)
*  PRELIMINARY RESULTS* Echocardiogram 2D Echocardiogram has been performed.  Karina Sampson 08/06/2020, 11:14 AM

## 2022-12-11 DEATH — deceased
